# Patient Record
Sex: Female | Born: 1975 | Race: White | Hispanic: No | Marital: Single | Smoking: Current every day smoker
Health system: Southern US, Community
[De-identification: ages and names within clinical notes are randomized; demographics above are authoritative.]

## PROBLEM LIST (undated history)

## (undated) DIAGNOSIS — T8859XA Other complications of anesthesia, initial encounter: Secondary | ICD-10-CM

## (undated) DIAGNOSIS — Z720 Tobacco use: Secondary | ICD-10-CM

## (undated) DIAGNOSIS — F1910 Other psychoactive substance abuse, uncomplicated: Secondary | ICD-10-CM

## (undated) DIAGNOSIS — T4145XA Adverse effect of unspecified anesthetic, initial encounter: Secondary | ICD-10-CM

## (undated) DIAGNOSIS — D649 Anemia, unspecified: Secondary | ICD-10-CM

## (undated) DIAGNOSIS — E611 Iron deficiency: Secondary | ICD-10-CM

## (undated) DIAGNOSIS — E119 Type 2 diabetes mellitus without complications: Secondary | ICD-10-CM

## (undated) DIAGNOSIS — F1020 Alcohol dependence, uncomplicated: Secondary | ICD-10-CM

## (undated) HISTORY — PX: LIVER BIOPSY: SHX301

---

## 1999-03-13 ENCOUNTER — Inpatient Hospital Stay (HOSPITAL_COMMUNITY): Admission: AD | Admit: 1999-03-13 | Discharge: 1999-03-16 | Payer: Self-pay | Admitting: *Deleted

## 1999-04-25 ENCOUNTER — Other Ambulatory Visit: Admission: RE | Admit: 1999-04-25 | Discharge: 1999-04-25 | Payer: Self-pay | Admitting: Obstetrics and Gynecology

## 1999-09-21 ENCOUNTER — Inpatient Hospital Stay (HOSPITAL_COMMUNITY): Admission: AD | Admit: 1999-09-21 | Discharge: 1999-09-22 | Payer: Self-pay | Admitting: *Deleted

## 2003-03-03 ENCOUNTER — Encounter: Payer: Self-pay | Admitting: Obstetrics & Gynecology

## 2003-03-03 ENCOUNTER — Inpatient Hospital Stay (HOSPITAL_COMMUNITY): Admission: AD | Admit: 2003-03-03 | Discharge: 2003-03-03 | Payer: Self-pay | Admitting: Obstetrics & Gynecology

## 2003-03-04 ENCOUNTER — Other Ambulatory Visit: Admission: RE | Admit: 2003-03-04 | Discharge: 2003-03-04 | Payer: Self-pay | Admitting: Obstetrics and Gynecology

## 2003-03-07 ENCOUNTER — Other Ambulatory Visit: Admission: RE | Admit: 2003-03-07 | Discharge: 2003-03-07 | Payer: Self-pay | Admitting: Obstetrics and Gynecology

## 2003-05-03 ENCOUNTER — Ambulatory Visit (HOSPITAL_COMMUNITY): Admission: RE | Admit: 2003-05-03 | Discharge: 2003-05-03 | Payer: Self-pay | Admitting: Obstetrics & Gynecology

## 2003-08-20 ENCOUNTER — Ambulatory Visit (HOSPITAL_COMMUNITY): Admission: AD | Admit: 2003-08-20 | Discharge: 2003-08-20 | Payer: Self-pay | Admitting: Obstetrics & Gynecology

## 2003-08-24 ENCOUNTER — Inpatient Hospital Stay (HOSPITAL_COMMUNITY): Admission: AD | Admit: 2003-08-24 | Discharge: 2003-08-26 | Payer: Self-pay | Admitting: Obstetrics & Gynecology

## 2005-07-15 ENCOUNTER — Encounter: Admission: RE | Admit: 2005-07-15 | Discharge: 2005-07-15 | Payer: Self-pay | Admitting: General Practice

## 2006-05-05 ENCOUNTER — Emergency Department (HOSPITAL_COMMUNITY): Admission: EM | Admit: 2006-05-05 | Discharge: 2006-05-05 | Payer: Self-pay | Admitting: Emergency Medicine

## 2007-02-22 ENCOUNTER — Other Ambulatory Visit: Payer: Self-pay | Admitting: Obstetrics & Gynecology

## 2007-02-22 ENCOUNTER — Emergency Department (HOSPITAL_COMMUNITY): Admission: EM | Admit: 2007-02-22 | Discharge: 2007-02-23 | Payer: Self-pay | Admitting: Emergency Medicine

## 2008-04-16 ENCOUNTER — Inpatient Hospital Stay (HOSPITAL_COMMUNITY): Admission: EM | Admit: 2008-04-16 | Discharge: 2008-04-18 | Payer: Self-pay | Admitting: Emergency Medicine

## 2008-04-18 ENCOUNTER — Inpatient Hospital Stay (HOSPITAL_COMMUNITY): Admission: AD | Admit: 2008-04-18 | Discharge: 2008-04-21 | Payer: Self-pay | Admitting: Psychiatry

## 2008-04-18 ENCOUNTER — Ambulatory Visit: Payer: Self-pay | Admitting: Psychiatry

## 2009-04-22 ENCOUNTER — Emergency Department (HOSPITAL_COMMUNITY): Admission: EM | Admit: 2009-04-22 | Discharge: 2009-04-22 | Payer: Self-pay | Admitting: Emergency Medicine

## 2010-10-16 NOTE — H&P (Signed)
[name], [name]                ACCOUNT NO.:  [id number]   MEDICAL RECORD NO.:  [id number]          PATIENT TYPE:  INP   LOCATION:  1824                         FACILITY:  [facility]   PHYSICIAN:  Lonia Blood, M.D.       DATE OF BIRTH:  27-Apr-1976   DATE OF ADMISSION:  04/16/2008  DATE OF DISCHARGE:                              HISTORY & PHYSICAL   PRIMARY CARE PHYSICIAN:  Lucky Cowboy, MD   CHIEF COMPLAINT:  Overdose.   HISTORY OF PRESENT ILLNESS:  [name] is a 35 year old woman with  history of anxiety who took about 30 tablets of 1 mg of Xanax due to  extreme stress.  The history is provided by the [name] who  says that the patient probably did not try to kill herself, but she is  on immense amount of stress and did an impulsive gesture.  He is not  aware of her having any prior history of suicide attempts.   PAST MEDICAL HISTORY:  Significant for anxiety.   FAMILY HISTORY:  Mother is alive, has suffered severe motor vehicle  accident.  Father's history is unknown.  The patient has a sister  without any psychiatric problems.   SOCIAL HISTORY:  The patient has 2 biological children and 2  stepchildren.  She works in Designer, jewellery.  She smokes a pack and a  half cigarettes a day.  Drinks alcohol occasionally.   ALLERGIES:  No known drug allergies.   MEDICATIONS:  Xanax.   REVIEW OF SYSTEMS:  Unobtainable.   PHYSICAL EXAMINATION:  VITAL SIGNS:  Temperature 98.5, pulse 90,  respirations 16, blood pressure 192/58.  GENERAL APPEARANCE:  This 35 year old woman who is comatose on the  stretcher.  HEENT:  Head appears normocephalic and atraumatic.  Eyes upon forcefully  opening both the Jane eyes, the pupils are mydriatic about 6 mm and  reactive to light.  The patient has an intact gag reflex.  CHEST:  Clear to auscultation without wheezes, rhonchi, or crackles.  HEART:  Regular rate and rhythm without murmurs, rubs, or gallops.  ABDOMEN:  Soft, nontender.   Bowel sounds are present.  LOWER EXTREMITIES:  Without edema.   LABORATORY VALUES:  On admission, sodium is 141, potassium 3.6, chloride  109, bicarbonate 26, BUN 9, creatinine 0.46, glucose 85.  Urinalysis  within normal limits.  Urine drug screen is positive for opiates and  benzodiazepines.  White blood cell count is 11.8, hemoglobin is 14,  platelet count 358.   ASSESSMENT AND PLAN:  This is a 35 year old woman with benzodiazepine  and opiate overdose currently comatose.  Plan is to admit her to an  intensive care unit for airway monitoring and vital signs monitoring.  Placed the patient on intravenous fluids, vitamins, and obtain a  psychiatric consultation once she wakes up.      Lonia Blood, M.D.  Electronically Signed     SL/MEDQ  D:  04/16/2008  T:  04/16/2008  Job:  [id number]

## 2010-10-19 NOTE — H&P (Signed)
[name], [name]                            ACCOUNT NO.:  [id number]   MEDICAL RECORD NO.:  [id number]                  PATIENT TYPE:   LOCATION:                                       FACILITY:   PHYSICIAN:  Lazaro Arms, M.D.                DATE OF BIRTH:  January 25, 1976   DATE OF ADMISSION:  DATE OF DISCHARGE:                                HISTORY & PHYSICAL   HISTORY OF PRESENT ILLNESS:  This patient is a 35 year old white female,  gravida 3, para 1, abortus 1 with estimated date of delivery of August 29, 2003.  Currently at 39-4/[redacted] weeks gestation.  The patient is admitted for  induction of labor.  Cervix, at the time of assessment on March 21 was  unfavorable; so, if it is still that way she will undergo Cervidil, if not  Foley will be placed in the cervix.  Her prenatal course has been  unremarkable except for an elevated Down syndrome risk, but she was seen at  [facility] perinatal and had an amniocentesis. It was 85 XY with normal  chromosomes; otherwise pregnancy has been uncomplicated.  The patient came  in a couple of days ago complaining of numbness and tingling of both arms,  increased swelling.  Her face was swollen, but her blood pressure was  normal.  I think that it was probably from nerve compression.  She is  admitted for induction electively.   PAST MEDICAL HISTORY:  PID at age 68.   PAST SURGERY:  Negative except for an elective termination in 1995.   PAST OBSTETRICAL:  1. Elective termination.  2. Vaginal delivery in 2001.   ALLERGIES:  None.   MEDICATIONS:  Prenatal vitamins.   REVIEW OF SYSTEMS:  Otherwise negative.  Blood type is A positive, antibody  screen is negative.  Rubella is immune.  Hepatitis B is negative.  HIV is  negative.  Serology is nonreactive.  Pap was normal.  GC and Chlamydia are  both negative.  Her Glucola was 101.  Her group B strep culture is negative.   PHYSICAL EXAMINATION:  HEENT:  Unremarkable.  NECK:  Thyroid is normal.  LUNGS:  Clear.  HEART:  Regular rate and rhythm.  No murmurs, regurgitation, or gallops.  BREASTS:  Deferred.  ABDOMEN:  Fundal height of 38 cm.  Cervix is fingertip thick and minus 3  vertex.  EXTREMITIES:  With 1+ edema.  NEUROLOGICAL:  Exam is grossly intact.   IMPRESSION:  1. Intrauterine pregnancy at 39.[redacted] weeks gestation.  2. Elective induction.   The patient understands the risks, benefits, indications and alternatives  and will proceed.     ___________________________________________                                         Amaryllis Dyke  Despina Hidden, M.D.   Loraine Maple  D:  08/24/2003  T:  08/24/2003  Job:  [id number]

## 2010-10-19 NOTE — Op Note (Signed)
NAMESHERHONDA, GASPAR                          ACCOUNT NO.:  [id number]   MEDICAL RECORD NO.:  [id number]                   PATIENT TYPE:  INP   LOCATION:  A403                                 FACILITY:  [facility]   PHYSICIAN:  Lazaro Arms, M.D.                DATE OF BIRTH:  Jun 19, 1975   DATE OF PROCEDURE:  08/25/2003  DATE OF DISCHARGE:  08/26/2003                                 OPERATIVE REPORT   PROCEDURE:  Epidural placement.   INDICATIONS FOR PROCEDURE:  [name] is a 35 year old gravida 2, para 1 being  induced, with a favorable cervix.  She is 4 cm and requesting an epidural.   DESCRIPTION OF PROCEDURE:  The patient was placed in the sitting position.  Betadine prep was used.  The L2-L3 interspace was identified.  Lidocaine 1%  was injected as a local anesthetic and the area was field draped.  A 17-  gauge Tuohy needle was employed and a loss of resistance technique used.  The epidural space was found with one pass without difficulty, and 10 cc of  0.125% bupivacaine plain was given as a test dose without ill effects.  An  additional 10 cc was given to dose up the epidural.  The epidural catheter  was fed and taped down 5 cm into the epidural space.  The continuous pump  was started at 12 cc an hour of 0.125% bupivacaine with 2 mcg per cc of  Fentanyl.   The patient tolerated the procedure well with no ill effects, with a  reassuring fetal heart rate tracing.      ___________________________________________                                            Lazaro Arms, M.D.   LHE/MEDQ  D:  09/13/2003  T:  09/13/2003  Job:  [id number]
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
## 2010-10-19 NOTE — Procedures (Signed)
NAMETRINTY, MARKEN                          ACCOUNT NO.:  [id number]   MEDICAL RECORD NO.:  [id number]                   PATIENT TYPE:  OUT   LOCATION:  RAD                                  FACILITY:  [facility]   PHYSICIAN:  Vida Roller, M.D.                DATE OF BIRTH:  1975-10-03   DATE OF PROCEDURE:  05/03/2003  DATE OF DISCHARGE:                                  ECHOCARDIOGRAM   TAPE NUMBER:  LB - [id number].   TAPE COUNT:  00 - 431.   INDICATIONS FOR PROCEDURE:  This is a 35 year old woman with chest pain.   TECHNICAL QUALITY:  Good.   M-MODE TRACINGS:  The aorta is 30 mm.   The left atrium is 39 mm.   The septum is 9 mm.   The posterior wall is 9 mm.   Left ventricular diastolic dimension is 51 mm.   Left ventricular systolic dimension is 35 mm.   2-D AND DOPPLER IMAGING:  The left ventricle is normal size with normal  systolic function. There are no wall motion abnormalities seen. The  diastolic function is normal. Estimated ejection fraction is 60% to 65%.   The right ventricle is normal size with normal systolic function. There is a  slight increase in the diastolic diameter, consistent with the patient's  pregnant state.   Both atria are normal size. There is no evidence of an atrial septal defect.   The aortic valve is trileaflet, tri-commissural with no evidence of stenosis  or regurgitation.   The mitral valve is morphologically normal with trivial insufficiency. No  stenosis is seen.   The tricuspid valve is morphologically normal with trivial insufficiency. No  stenosis is seen.   The pulmonic valve appears to be morphologically unremarkable with no  stenosis or trivial insufficiency.   Pericardial structures appear normal.   The ascending aorta is not well seen.   The inferior vena cava appears to be normal size.      ___________________________________________                                            Vida Roller, M.D.   JH/MEDQ  D:   05/04/2003  T:  05/04/2003  Job:  [id number]

## 2010-10-19 NOTE — Op Note (Signed)
[name], [name]                          ACCOUNT NO.:  [id number]   MEDICAL RECORD NO.:  [id number]                   PATIENT TYPE:  INP   LOCATION:  A403                                 FACILITY:  [facility]   PHYSICIAN:  Jacklyn Shell, C.N.M.    DATE OF BIRTH:  Sep 06, 1975   DATE OF PROCEDURE:  DATE OF DISCHARGE:                                 OPERATIVE REPORT   DELIVERY NOTE   [name] progressed nicely through labor and developed an urge to push at  approximately 11:45 a.m.  She pushed steadily without difficulty and  delivered a viable female infant at 32.  A nuchal cord was reduced.  The  mouth and nose were suctioned on the perineum; and the body delivered  without difficulty.  Twenty units of Pitocin diluted in 1000 cc of lactated  Ringers was then infused rapidly IV.   The placenta separated spontaneously and delivered by a controlled cord  traction at 1342.  It was inspected and appears to be intact.  She did have  some extra blood loss and some oozing from the uterus.  Some clots were  removed and 125 mcg of Hemabate were given IM and this resolved her  bleeding.  The vagina was then inspected and no lacerations were found.  The  weight of the baby 6 pounds 15 ounces.  Apgars were 9 and 9.  The epidural  catheter was then removed with the blue tip visualized as being intact.  The  family bonded very appropriately with the baby and tolerated the delivery  well.      ___________________________________________                                            Jacklyn Shell, C.N.M.   FC/MEDQ  D:  08/25/2003  T:  08/25/2003  Job:  [id number]   cc:   [facility]
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
## 2011-03-06 LAB — RAPID URINE DRUG SCREEN, HOSP PERFORMED
Amphetamines: NOT DETECTED
Barbiturates: NOT DETECTED
Benzodiazepines: POSITIVE — AB
Cocaine: NOT DETECTED
Opiates: POSITIVE — AB
Tetrahydrocannabinol: NOT DETECTED

## 2011-03-06 LAB — URINALYSIS, ROUTINE W REFLEX MICROSCOPIC
Bilirubin Urine: NEGATIVE
Glucose, UA: NEGATIVE
Hgb urine dipstick: NEGATIVE
Ketones, ur: NEGATIVE
Nitrite: NEGATIVE
Protein, ur: NEGATIVE
Specific Gravity, Urine: 1.007
Urobilinogen, UA: 0.2
pH: 6.5

## 2011-03-06 LAB — CBC
HCT: 40.5
Hemoglobin: 14
MCHC: 34.5
MCV: 95.2
Platelets: 235
Platelets: 358
RBC: 4.25
RDW: 13.6
RDW: 13.7
WBC: 11.8 — ABNORMAL HIGH
WBC: 8.2

## 2011-03-06 LAB — COMPREHENSIVE METABOLIC PANEL WITH GFR
BUN: 9
Calcium: 8.8
Creatinine, Ser: 0.46
Glucose, Bld: 85
Total Protein: 6.1

## 2011-03-06 LAB — COMPREHENSIVE METABOLIC PANEL
ALT: 10
ALT: 11
AST: 12
AST: 15
Albumin: 3.4 — ABNORMAL LOW
Albumin: 3.7
Alkaline Phosphatase: 58
Alkaline Phosphatase: 70
CO2: 26
Chloride: 108
Chloride: 109
GFR calc Af Amer: >60
GFR calc Af Amer: >60
GFR calc non Af Amer: >60
Potassium: 3.4 — ABNORMAL LOW
Potassium: 3.6
Sodium: 141
Sodium: 142
Total Bilirubin: 0.4
Total Bilirubin: 0.5
Total Protein: 5.6 — ABNORMAL LOW

## 2011-03-06 LAB — DIFFERENTIAL
Basophils Absolute: 0.1
Basophils Relative: 1
Eosinophils Absolute: 0.2
Eosinophils Relative: 2
Lymphocytes Relative: 36
Lymphs Abs: 4.3 — ABNORMAL HIGH
Monocytes Absolute: 0.7
Monocytes Relative: 6
Neutro Abs: 6.4
Neutrophils Relative %: 55

## 2011-03-06 LAB — BASIC METABOLIC PANEL
BUN: 10
Calcium: 8.5
GFR calc non Af Amer: >60
Glucose, Bld: 99

## 2011-03-06 LAB — ETHANOL: Alcohol, Ethyl (B): 147 — ABNORMAL HIGH

## 2011-03-06 LAB — PREGNANCY, URINE: Preg Test, Ur: NEGATIVE

## 2011-03-06 LAB — ACETAMINOPHEN LEVEL: Acetaminophen (Tylenol), Serum: <10 — ABNORMAL LOW

## 2011-03-14 LAB — URINALYSIS, ROUTINE W REFLEX MICROSCOPIC
Bilirubin Urine: NEGATIVE
Glucose, UA: NEGATIVE
Hgb urine dipstick: NEGATIVE
Ketones, ur: 15 — AB
Protein, ur: NEGATIVE

## 2011-03-14 LAB — POCT PREGNANCY, URINE: Preg Test, Ur: NEGATIVE

## 2014-11-30 LAB — OB RESULTS CONSOLE RPR: RPR: NONREACTIVE

## 2014-11-30 LAB — OB RESULTS CONSOLE ANTIBODY SCREEN: ANTIBODY SCREEN: NEGATIVE

## 2014-11-30 LAB — OB RESULTS CONSOLE HIV ANTIBODY (ROUTINE TESTING)
HIV: NONREACTIVE
HIV: NONREACTIVE

## 2014-11-30 LAB — OB RESULTS CONSOLE HEPATITIS B SURFACE ANTIGEN: Hepatitis B Surface Ag: NEGATIVE

## 2014-11-30 LAB — OB RESULTS CONSOLE ABO/RH: RH Type: POSITIVE

## 2014-11-30 LAB — OB RESULTS CONSOLE RUBELLA ANTIBODY, IGM: RUBELLA: IMMUNE

## 2014-12-02 ENCOUNTER — Other Ambulatory Visit (HOSPITAL_COMMUNITY): Payer: Self-pay | Admitting: Obstetrics and Gynecology

## 2014-12-02 DIAGNOSIS — IMO0002 Reserved for concepts with insufficient information to code with codable children: Secondary | ICD-10-CM

## 2014-12-02 DIAGNOSIS — O09522 Supervision of elderly multigravida, second trimester: Secondary | ICD-10-CM

## 2014-12-02 DIAGNOSIS — Z0489 Encounter for examination and observation for other specified reasons: Secondary | ICD-10-CM

## 2014-12-02 LAB — US OB FOLLOW UP

## 2014-12-08 ENCOUNTER — Encounter (HOSPITAL_COMMUNITY): Payer: Self-pay | Admitting: Obstetrics and Gynecology

## 2014-12-13 ENCOUNTER — Ambulatory Visit (HOSPITAL_COMMUNITY): Payer: Medicaid Other

## 2014-12-13 ENCOUNTER — Ambulatory Visit (HOSPITAL_COMMUNITY): Admission: RE | Admit: 2014-12-13 | Payer: Medicaid Other | Source: Ambulatory Visit

## 2015-01-06 ENCOUNTER — Other Ambulatory Visit (HOSPITAL_COMMUNITY): Payer: Self-pay | Admitting: Obstetrics and Gynecology

## 2015-01-06 ENCOUNTER — Ambulatory Visit (HOSPITAL_COMMUNITY)
Admission: RE | Admit: 2015-01-06 | Discharge: 2015-01-06 | Disposition: A | Discharge status: Home / Self Care | Payer: Medicaid Other | Source: Ambulatory Visit | Attending: Obstetrics and Gynecology | Admitting: Obstetrics and Gynecology

## 2015-01-06 ENCOUNTER — Encounter (HOSPITAL_COMMUNITY): Payer: Self-pay

## 2015-01-06 DIAGNOSIS — Z0489 Encounter for examination and observation for other specified reasons: Secondary | ICD-10-CM

## 2015-01-06 DIAGNOSIS — Z3A26 26 weeks gestation of pregnancy: Secondary | ICD-10-CM | POA: Insufficient documentation

## 2015-01-06 DIAGNOSIS — IMO0002 Reserved for concepts with insufficient information to code with codable children: Secondary | ICD-10-CM

## 2015-01-06 DIAGNOSIS — Z315 Encounter for genetic counseling: Secondary | ICD-10-CM | POA: Diagnosis present

## 2015-01-06 DIAGNOSIS — Z36 Encounter for antenatal screening of mother: Secondary | ICD-10-CM | POA: Diagnosis not present

## 2015-01-06 DIAGNOSIS — O09529 Supervision of elderly multigravida, unspecified trimester: Secondary | ICD-10-CM | POA: Insufficient documentation

## 2015-01-06 DIAGNOSIS — O09522 Supervision of elderly multigravida, second trimester: Secondary | ICD-10-CM | POA: Diagnosis not present

## 2015-01-06 NOTE — Progress Notes (Addendum)
Genetic Counseling  High-Risk Gestation Note  Appointment Date:  01/06/2015 Referred By: Marylen Ponto, DO Date of Birth:  Feb 25, 1976 Partner:  [name]   Pregnancy History: [id number] Estimated Date of Delivery: 04/12/15 Estimated Gestational Age: [redacted]w[redacted]d Attending: Particia Nearing, MD   [name] and her partner, Mr. [name], were seen for genetic counseling because of a maternal age of 39 y.o..     In Summary:  Advanced maternal age; discussed options of ultrasound, NIPS, and amniocentesis  Patient elected for ultrasound only today; declined NIPS and amniocentesis at this time  They were counseled regarding maternal age and the association with risk for chromosome conditions due to nondisjunction with aging of the ova.   We reviewed chromosomes, nondisjunction, and the associated 1 in 11 risk for fetal aneuploidy related to a maternal age of 39 y.o. at [redacted]w[redacted]d gestation.  They were counseled that the risk for aneuploidy decreases as gestational age increases, accounting for those pregnancies which spontaneously abort.  We specifically discussed Down syndrome (trisomy 47), trisomies 31 and 59, and sex chromosome aneuploidies (47,XXX and 47,XXY) including the common features and prognoses of each.   We reviewed available screening options including noninvasive prenatal screening (NIPS)/cell free DNA (cfDNA) testing and detailed ultrasound.  They were counseled that screening tests are used to modify a patient's a priori risk for aneuploidy, typically based on age. This estimate provides a pregnancy specific risk assessment. We reviewed the benefits and limitations of each option. Specifically, we discussed the conditions for which each test screens, the detection rates, and false positive rates of each. They were also counseled regarding diagnostic testing via amniocentesis. We reviewed the approximate 1 in 300-500 risk for complications for amniocentesis, including spontaneous pregnancy  loss. After consideration of all the options, she elected to proceed with targeted ultrasound only today and declined NIPS and amniocentesis.   A complete ultrasound was performed today. The ultrasound report will be sent under separate cover. They understand that screening tests cannot rule out all birth defects or genetic syndromes. The patient was advised of this limitation and states she still does not want additional testing at this time.   [name] was provided with written information regarding cystic fibrosis (CF) including the carrier frequency and incidence in the Caucasian population, the availability of carrier testing and prenatal diagnosis if indicated.  In addition, we discussed that CF is routinely screened for as part of the [place] newborn screening panel.  She declined CF testing today.   Both family histories were reviewed and found to be noncontributory for birth defects, intellectual disability, and known genetic conditions. Without further information regarding the provided family history, an accurate genetic risk cannot be calculated. Further genetic counseling is warranted if more information is obtained.  [name] denied exposure to environmental toxins or chemical agents. She denied the use of street drugs. She reported drinking alcohol daily during the first month of pregnancy and discontinued alcohol use once becoming aware of the pregnancy at approximately 4-[redacted] weeks gestation. We discussed that the majority of the reported alcohol exposure likely occurred within the "all-or-none period." The all-or-none period was discussed, meaning exposures that occur in the first 4 weeks of gestation are typically thought to either not affect the pregnancy at all or result in a miscarriage. Prenatal alcohol exposure can increase the risk for growth delays, small head size, heart defects, eye and facial differences, as well as behavior problems and learning disabilities. The  risk  of these to occur tends to increase with the amount of alcohol consumed. However, because there is no identified safe amount of alcohol in pregnancy, it is recommended to completely avoid alcohol in pregnancy. Ms. Sleeper understands that there is no definitive test to assess prenatally for fetal alcohol syndrome or fetal alcohol effects. However, we discussed the option of detailed ultrasound to assess fetal growth and development. She reported smoking one pack of cigarettes per day. The associations of smoking in pregnancy were reviewed and cessation encouraged. She denied significant viral illnesses during the course of her pregnancy. Her medical and surgical histories were noncontributory.   I counseled this couple regarding the above risks and available options.  The approximate face-to-face time with the genetic counselor was 40 minutes.  Quinn Plowman, MS,  Certified Genetic Counselor 01/06/2015

## 2015-01-09 ENCOUNTER — Encounter (HOSPITAL_COMMUNITY): Payer: Self-pay | Admitting: Obstetrics and Gynecology

## 2015-01-09 ENCOUNTER — Other Ambulatory Visit (HOSPITAL_COMMUNITY): Payer: Self-pay | Admitting: Obstetrics and Gynecology

## 2015-01-30 ENCOUNTER — Other Ambulatory Visit (HOSPITAL_COMMUNITY): Payer: Self-pay | Admitting: Obstetrics and Gynecology

## 2015-01-30 DIAGNOSIS — Z3A28 28 weeks gestation of pregnancy: Secondary | ICD-10-CM

## 2015-01-30 DIAGNOSIS — O09523 Supervision of elderly multigravida, third trimester: Secondary | ICD-10-CM

## 2015-01-30 DIAGNOSIS — O2693 Pregnancy related conditions, unspecified, third trimester: Secondary | ICD-10-CM

## 2015-02-03 ENCOUNTER — Encounter (HOSPITAL_COMMUNITY): Payer: Self-pay

## 2015-02-03 ENCOUNTER — Ambulatory Visit (HOSPITAL_COMMUNITY)
Admission: RE | Admit: 2015-02-03 | Discharge: 2015-02-03 | Disposition: A | Discharge status: Home / Self Care | Payer: Medicaid Other | Source: Ambulatory Visit | Attending: Obstetrics and Gynecology | Admitting: Obstetrics and Gynecology

## 2015-02-03 DIAGNOSIS — O09523 Supervision of elderly multigravida, third trimester: Secondary | ICD-10-CM | POA: Diagnosis not present

## 2015-02-03 DIAGNOSIS — O2693 Pregnancy related conditions, unspecified, third trimester: Secondary | ICD-10-CM

## 2015-02-03 DIAGNOSIS — Z3A28 28 weeks gestation of pregnancy: Secondary | ICD-10-CM

## 2015-02-17 ENCOUNTER — Encounter (HOSPITAL_COMMUNITY): Payer: Self-pay

## 2015-02-17 ENCOUNTER — Inpatient Hospital Stay (HOSPITAL_COMMUNITY)
Admission: AD | Admit: 2015-02-17 | Discharge: 2015-02-21 | DRG: 774 | Disposition: A | Discharge status: Home / Self Care | Payer: Medicaid Other | Source: Ambulatory Visit | Attending: Family Medicine | Admitting: Family Medicine

## 2015-02-17 ENCOUNTER — Ambulatory Visit (HOSPITAL_COMMUNITY)
Admission: RE | Admit: 2015-02-17 | Discharge: 2015-02-17 | Disposition: A | Discharge status: Home / Self Care | Payer: Medicaid Other | Source: Ambulatory Visit | Attending: Obstetrics and Gynecology | Admitting: Obstetrics and Gynecology

## 2015-02-17 ENCOUNTER — Encounter (HOSPITAL_COMMUNITY): Payer: Self-pay | Admitting: *Deleted

## 2015-02-17 VITALS — BP 116/92 | HR 105 | Wt 136.0 lb

## 2015-02-17 DIAGNOSIS — O26899 Other specified pregnancy related conditions, unspecified trimester: Secondary | ICD-10-CM

## 2015-02-17 DIAGNOSIS — O99333 Smoking (tobacco) complicating pregnancy, third trimester: Secondary | ICD-10-CM | POA: Diagnosis present

## 2015-02-17 DIAGNOSIS — R109 Unspecified abdominal pain: Secondary | ICD-10-CM

## 2015-02-17 DIAGNOSIS — Z3A30 30 weeks gestation of pregnancy: Secondary | ICD-10-CM

## 2015-02-17 DIAGNOSIS — O09523 Supervision of elderly multigravida, third trimester: Secondary | ICD-10-CM

## 2015-02-17 DIAGNOSIS — O43123 Velamentous insertion of umbilical cord, third trimester: Secondary | ICD-10-CM | POA: Diagnosis present

## 2015-02-17 DIAGNOSIS — O4403 Placenta previa specified as without hemorrhage, third trimester: Secondary | ICD-10-CM | POA: Diagnosis present

## 2015-02-17 DIAGNOSIS — O24419 Gestational diabetes mellitus in pregnancy, unspecified control: Secondary | ICD-10-CM | POA: Diagnosis not present

## 2015-02-17 DIAGNOSIS — O24410 Gestational diabetes mellitus in pregnancy, diet controlled: Secondary | ICD-10-CM | POA: Diagnosis present

## 2015-02-17 DIAGNOSIS — O09529 Supervision of elderly multigravida, unspecified trimester: Secondary | ICD-10-CM

## 2015-02-17 HISTORY — DX: Anemia, unspecified: D64.9

## 2015-02-17 HISTORY — DX: Iron deficiency: E61.1

## 2015-02-17 HISTORY — DX: Adverse effect of unspecified anesthetic, initial encounter: T41.45XA

## 2015-02-17 HISTORY — DX: Type 2 diabetes mellitus without complications: E11.9

## 2015-02-17 HISTORY — DX: Other complications of anesthesia, initial encounter: T88.59XA

## 2015-02-17 LAB — CBC
HCT: 31.3 % — ABNORMAL LOW (ref 36.0–46.0)
Hemoglobin: 10.6 g/dL — ABNORMAL LOW (ref 12.0–15.0)
MCH: 31.4 pg (ref 26.0–34.0)
MCHC: 33.9 g/dL (ref 30.0–36.0)
MCV: 92.6 fL (ref 78.0–100.0)
PLATELETS: 317 10*3/uL (ref 150–400)
RBC: 3.38 MIL/uL — ABNORMAL LOW (ref 3.87–5.11)
RDW: 14.3 % (ref 11.5–15.5)
WBC: 17.4 10*3/uL — ABNORMAL HIGH (ref 4.0–10.5)

## 2015-02-17 LAB — URINALYSIS, ROUTINE W REFLEX MICROSCOPIC
BILIRUBIN URINE: NEGATIVE
GLUCOSE, UA: NEGATIVE mg/dL
HGB URINE DIPSTICK: NEGATIVE
Ketones, ur: 15 mg/dL — AB
Leukocytes, UA: NEGATIVE
Nitrite: NEGATIVE
PROTEIN: NEGATIVE mg/dL
Specific Gravity, Urine: 1.015 (ref 1.005–1.030)
UROBILINOGEN UA: 0.2 mg/dL (ref 0.0–1.0)
pH: 8.5 — ABNORMAL HIGH (ref 5.0–8.0)

## 2015-02-17 LAB — GLUCOSE, CAPILLARY
GLUCOSE-CAPILLARY: 103 mg/dL — AB (ref 65–99)
Glucose-Capillary: 95 mg/dL (ref 65–99)

## 2015-02-17 MED ORDER — ZOLPIDEM TARTRATE 5 MG PO TABS
5.0000 mg | ORAL_TABLET | Freq: Every evening | ORAL | Status: DC | PRN
  Administered 2015-02-18 – 2015-02-20 (×3): 5 mg via ORAL
  Filled 2015-02-17 (×3): qty 1

## 2015-02-17 MED ORDER — CALCIUM CARBONATE ANTACID 500 MG PO CHEW: 2.0000 | CHEWABLE_TABLET | ORAL | Status: DC | PRN

## 2015-02-17 MED ORDER — DOCUSATE SODIUM 100 MG PO CAPS
100.0000 mg | ORAL_CAPSULE | Freq: Every day | ORAL | Status: DC
  Filled 2015-02-17: qty 1

## 2015-02-17 MED ORDER — ACETAMINOPHEN 325 MG PO TABS: 650.0000 mg | ORAL_TABLET | ORAL | Status: DC | PRN

## 2015-02-17 MED ORDER — PRENATAL MULTIVITAMIN CH
1.0000 | ORAL_TABLET | Freq: Every day | ORAL | Status: DC
  Administered 2015-02-18 – 2015-02-20 (×3): 1 via ORAL
  Filled 2015-02-17 (×3): qty 1

## 2015-02-17 NOTE — MAU Note (Signed)
Patient presents at [redacted] weeks gestation stating she is here to be monitored. Fetus active. Denies pain, bleeding or discharge.

## 2015-02-17 NOTE — H&P (Signed)
[name] is a 39 y.o. female [phone] @ 30.5wks presenting for admission due to abd cramping approx 3-5x/day. This preg is followed by [facility] in [place] w/ MFM consult, and is significant for 1) low lying placenta w/ velamentous insertion and vasa previa 2) AMA (no testing) 3) A1GDM.  History OB History    Gravida Para Term Preterm AB TAB SAB Ectopic Multiple Living   0 1 1 0 0 0 2     Past Medical History  Diagnosis Date  . Complication of anesthesia   . Medical history non-contributory    Past Surgical History  Procedure Laterality Date  . Liver biopsy     Family History: family history is not on file. Social History:  reports that she has been smoking.  She does not have any smokeless tobacco history on file. She reports that she does not drink alcohol or use illicit drugs.   Prenatal Transfer Tool  Maternal Diabetes: Yes:  Diabetes Type:  Diet controlled Genetic Screening: Declined Maternal Ultrasounds/Referrals: Abnormal:  Findings:   Other:low lying placenta, velamentous ins, vasa previa Fetal Ultrasounds or other Referrals:  None Maternal Substance Abuse:  No Significant Maternal Medications:  None Significant Maternal Lab Results:  None Other Comments:  None  ROS    Blood pressure 101/55, pulse 86, temperature 98.3 F (36.8 C), temperature source Oral, resp. rate 16, height  (1.626 m), weight 63.05 kg (139 lb), last menstrual period 07/06/2014. Exam Physical Exam  Constitutional: She is oriented to person, place, and time. She appears well-developed.  HENT:  Head: Normocephalic.  Neck: Normal range of motion.  Cardiovascular: Normal rate.   Respiratory: Effort normal.  GI:  Baby difficult to trace: EFM 115-120s, +accels, no decels No ctx appreciated on toco  Genitourinary:  Pelvic exam deferred  Musculoskeletal: Normal range of motion.  Neurological: She is alert and oriented to person, place, and time.  Skin: Skin is warm and  dry.  Psychiatric: She has a normal mood and affect. Her behavior is normal. Thought content normal.    U/S today: cephalic, nl fluid, still w/ low lying placenta, velamentous ins, vasa previa  Prenatal labs: from 11/30/14 ABO, Rh:  A+ Antibody:  neg Rubella:  imm RPR:   NR HBsAg:   neg HIV:   NR GBS:     Assessment/Plan: IUP@ 30.5wks Abd cramping Low lying placenta w/ velamentous insertion Vasa Previa A1GDM  Admit to Antenatal per MFM Twice daily NSTs Anticipate delivery for 34-36wks BMZ as delivery approaches  Carb mod diet w/ CBGs  SHAW, KIMBERLY CNM 02/17/2015, 2:34 PM

## 2015-02-18 ENCOUNTER — Encounter (HOSPITAL_COMMUNITY): Payer: Self-pay | Admitting: *Deleted

## 2015-02-18 LAB — GLUCOSE, CAPILLARY
GLUCOSE-CAPILLARY: 94 mg/dL (ref 65–99)
GLUCOSE-CAPILLARY: 149 mg/dL — AB (ref 65–99)
Glucose-Capillary: 83 mg/dL (ref 65–99)
Glucose-Capillary: 109 mg/dL — ABNORMAL HIGH (ref 65–99)

## 2015-02-18 MED ORDER — BETAMETHASONE SOD PHOS & ACET 6 (3-3) MG/ML IJ SUSP
12.0000 mg | Freq: Every day | INTRAMUSCULAR | Status: AC
  Administered 2015-02-18 – 2015-02-19 (×2): 12 mg via INTRAMUSCULAR
  Filled 2015-02-18 (×3): qty 2

## 2015-02-18 NOTE — Progress Notes (Signed)
Patient ID: [name], female   DOB: 18-Nov-1975, 39 y.o.   MRN: [id number] FACULTY PRACTICE ANTEPARTUM(COMPREHENSIVE) NOTE  [name] is a 39 y.o. [id number] at [redacted]w[redacted]d  who is admitted for observation secondary to vasa previa and velamentous cord insertion and abdominal cramping.    Fetal presentation is cephalic. Length of Stay:  1  Days  Date of admission:02/17/2015  Subjective: Patient reports feeling well but not rested. She reports irregular cramping pain- once every couple of hour Patient reports the fetal movement as active. Patient reports uterine contraction  activity as irregular, every 120 minutes. Patient reports  vaginal bleeding as none. Patient describes fluid per vagina as None.  Vitals:  Blood pressure 132/47, pulse 83, temperature 98.2 F (36.8 C), temperature source Oral, resp. rate 18, height 5\' 4"  (1.626 m), weight 139 lb (63.05 kg), last menstrual period 07/06/2014. Filed Vitals:   02/17/15 1541 02/17/15 1758 02/17/15 1944 02/18/15 0556  BP: 119/55 123/54 111/64 132/47  Pulse: 87 89 91 83  Temp: 98.4 F (36.9 C) 97.7 F (36.5 C) 98.5 F (36.9 C) 98.2 F (36.8 C)  TempSrc: Oral Axillary Oral Oral  Resp: 20 18 18 18   Height:      Weight:       Physical Examination:  General appearance - alert, well appearing, and in no distress Fundal Height:  size equals dates Pelvic Exam:  examination not indicated Cervical Exam: Not evaluated.  Extremities: Homans sign is negative, no sign of DVT with DTRs 2+ bilaterally Membranes:intact  Fetal Monitoring:  Baseline: 120 bpm, Variability: Good {> 6 bpm), Accelerations: Reactive, Decelerations: Absent and Toco: no contractions     Labs:  Results for orders placed or performed during the hospital encounter of 02/17/15 (from the past 24 hour(s))  Urinalysis, Routine w reflex microscopic (not at [facility])   Collection Time: 02/17/15 11:58 AM  Result Value Ref Range   Color, Urine YELLOW YELLOW   APPearance CLEAR CLEAR    Specific Gravity, Urine 1.015 1.005 - 1.030   pH 8.5 (H) 5.0 - 8.0   Glucose, UA NEGATIVE NEGATIVE mg/dL   Hgb urine dipstick NEGATIVE NEGATIVE   Bilirubin Urine NEGATIVE NEGATIVE   Ketones, ur 15 (A) NEGATIVE mg/dL   Protein, ur NEGATIVE NEGATIVE mg/dL   Urobilinogen, UA 0.2 0.0 - 1.0 mg/dL   Nitrite NEGATIVE NEGATIVE   Leukocytes, UA NEGATIVE NEGATIVE  CBC   Collection Time: 02/17/15  2:59 PM  Result Value Ref Range   WBC 17.4 (H) 4.0 - 10.5 K/uL   RBC 3.38 (L) 3.87 - 5.11 MIL/uL   Hemoglobin 10.6 (L) 12.0 - 15.0 g/dL   HCT 29.5 (L) 28.4 - 13.2 %   MCV 92.6 78.0 - 100.0 fL   MCH 31.4 26.0 - 34.0 pg   MCHC 33.9 30.0 - 36.0 g/dL   RDW 44.0 10.2 - 72.5 %   Platelets 317 150 - 400 K/uL  Type and screen   Collection Time: 02/17/15  2:59 PM  Result Value Ref Range   ABO/RH(D) A POS    Antibody Screen POS    Sample Expiration 02/20/2015    Antibody Identification NO CLINICALLY SIGNIFICANT ANTIBODY IDENTIFIED    DAT, IgG NEG    Unit Number [id number]    Blood Component Type RED CELLS,LR    Unit division 00    Status of Unit ALLOCATED    Transfusion Status OK TO TRANSFUSE    Crossmatch Result COMPATIBLE    Unit Number [id number]  Blood Component Type RED CELLS,LR    Unit division 00    Status of Unit ALLOCATED    Transfusion Status OK TO TRANSFUSE    Crossmatch Result COMPATIBLE   Glucose, capillary   Collection Time: 02/17/15  6:14 PM  Result Value Ref Range   Glucose-Capillary 103 (H) 65 - 99 mg/dL   Comment 1 Notify RN    Comment 2 Document in Chart   Glucose, capillary   Collection Time: 02/17/15 10:29 PM  Result Value Ref Range   Glucose-Capillary 95 65 - 99 mg/dL  Glucose, capillary   Collection Time: 02/18/15  5:58 AM  Result Value Ref Range   Glucose-Capillary 83 65 - 99 mg/dL    Imaging Studies:      Medications:  Scheduled . docusate sodium  100 mg Oral Daily  . prenatal multivitamin  1 tablet Oral Q1200   I have reviewed the patient's  current medications.  ASSESSMENT: [id number] [redacted]w[redacted]d Estimated Date of Delivery: 04/23/15  Patient Active Problem List   Diagnosis Date Noted  . Vasa previa 02/17/2015  . Advanced maternal age in multigravida 01/06/2015    PLAN: 39 yo with vasa previa and velamentous cord insertion - Continue twice daily fetal testing - Consider betamethasone - Will discuss this case with MFM again  CONSTANT,PEGGY 02/18/2015,7:12 AM

## 2015-02-19 LAB — GLUCOSE, CAPILLARY
GLUCOSE-CAPILLARY: 131 mg/dL — AB (ref 65–99)
GLUCOSE-CAPILLARY: 130 mg/dL — AB (ref 65–99)
GLUCOSE-CAPILLARY: 131 mg/dL — AB (ref 65–99)
Glucose-Capillary: 100 mg/dL — ABNORMAL HIGH (ref 65–99)
Glucose-Capillary: 138 mg/dL — ABNORMAL HIGH (ref 65–99)

## 2015-02-19 MED ORDER — GLYBURIDE 2.5 MG PO TABS
2.5000 mg | ORAL_TABLET | Freq: Two times a day (BID) | ORAL | Status: DC
  Administered 2015-02-19 – 2015-02-20 (×4): 2.5 mg via ORAL
  Filled 2015-02-19 (×5): qty 1

## 2015-02-19 NOTE — Progress Notes (Signed)
Patient ID: [name], female   DOB: 09/13/1975, 39 y.o.   MRN: [id number]  FACULTY PRACTICE ANTEPARTUM(COMPREHENSIVE) NOTE  [name] is a 39 y.o. [id number] at [redacted]w[redacted]d  who is admitted for vasa previa and mild cramping.   Length of Stay:  2  Days  Subjective: Patient reports the fetal movement as active. Patient reports uterine contraction  activity as none. Patient reports  vaginal bleeding as none. Patient describes fluid per vagina as None.  Vitals:  Blood pressure 108/56, pulse 95, temperature 98.1 F (36.7 C), temperature source Oral, resp. rate 18, height  (1.626 m), weight 139 lb (63.05 kg), last menstrual period 07/06/2014. Physical Examination:  General appearance - alert, well appearing, and in no distress Abdomen - soft, nontender,  Extremities - no pedal edema noted, Homan's sign negative bilaterally  Fetal Monitoring:  Baseline: 120 bpm, Variability: Good {> 6 bpm), Accelerations: Reactive and Decelerations: Absent  Labs:  Results for orders placed or performed during the hospital encounter of 02/17/15 (from the past 24 hour(s))  Glucose, capillary   Collection Time: 02/18/15  9:51 AM  Result Value Ref Range   Glucose-Capillary 109 (H) 65 - 99 mg/dL   Comment 1 Notify RN   Glucose, capillary   Collection Time: 02/18/15  2:32 PM  Result Value Ref Range   Glucose-Capillary 94 65 - 99 mg/dL   Comment 1 Notify RN   Glucose, capillary   Collection Time: 02/18/15 10:43 PM  Result Value Ref Range   Glucose-Capillary 149 (H) 65 - 99 mg/dL  Glucose, capillary   Collection Time: 02/19/15  6:05 AM  Result Value Ref Range   Glucose-Capillary 131 (H) 65 - 99 mg/dL    Medications:  Scheduled . betamethasone acetate-betamethasone sodium phosphate  12 mg Intramuscular Daily  . docusate sodium  100 mg Oral Daily  . glyBURIDE  2.5 mg Oral Q12H  . prenatal multivitamin  1 tablet Oral Q1200   I have reviewed the patient's current  medications.  ASSESSMENT: Patient Active Problem List   Diagnosis Date Noted  . Vasa previa 02/17/2015  . Advanced maternal age in multigravida 01/06/2015    PLAN: Continue betamethasone course Glyburide for hyperglycemia post steroids Will discuss with MFM long term plan.  LEGGETT,KELLY H. 02/19/2015,8:23 AM

## 2015-02-20 ENCOUNTER — Encounter (HOSPITAL_COMMUNITY): Payer: Self-pay | Admitting: *Deleted

## 2015-02-20 DIAGNOSIS — O24419 Gestational diabetes mellitus in pregnancy, unspecified control: Secondary | ICD-10-CM

## 2015-02-20 LAB — TYPE AND SCREEN
ABO/RH(D): A POS
ANTIBODY SCREEN: POSITIVE
DAT, IGG: NEGATIVE
UNIT DIVISION: 0
Unit division: 0

## 2015-02-20 LAB — GLUCOSE, CAPILLARY
GLUCOSE-CAPILLARY: 113 mg/dL — AB (ref 65–99)
GLUCOSE-CAPILLARY: 97 mg/dL (ref 65–99)
Glucose-Capillary: 131 mg/dL — ABNORMAL HIGH (ref 65–99)
Glucose-Capillary: 111 mg/dL — ABNORMAL HIGH (ref 65–99)

## 2015-02-20 NOTE — Progress Notes (Signed)
Introduced spiritual care services to patient who states she is hoping to get some more "home time" but anticipates having to be at the hospital until delivery. She has two older children, but is sad that her daughter is pretty much on her own because her step father works nights and sleeps during the day.  She was encouraged to see her son who would be bisiting this afternoon.  She states she feels she is going to go stir crazy and welcomed coloring sheets that I provided.  Pt welcomed spiritual and social support from chaplain. WE had a long conversation about several things.  Will continue to follow.

## 2015-02-20 NOTE — Progress Notes (Signed)
Patient ID: AVICE FUNCHESS, female   DOB: 1975/08/30, 39 y.o.   MRN: [id number] FACULTY PRACTICE ANTEPARTUM(COMPREHENSIVE) NOTE  [name] is a 39 y.o. [id number] at [redacted]w[redacted]d by best clinical estimate who is admitted for cramping with vasa previa.   Fetal presentation is cephalic. Length of Stay:  3  Days  Subjective: Doing well, almost no cramping over last 24 hours. Patient reports the fetal movement as active. Patient reports uterine contraction  activity as none. Patient reports  vaginal bleeding as none. Patient describes fluid per vagina as None.  Vitals:  Blood pressure 122/55, pulse 98, temperature 98.1 F (36.7 C), temperature source Oral, resp. rate 18, height  (1.626 m), weight 139 lb (63.05 kg), last menstrual period 07/06/2014. Physical Examination:  General appearance - alert, well appearing, and in no distress Chest - normal effort Abdomen - gravid, NT Neurological - alert, oriented, normal speech, no focal findings or movement disorder noted Skin - normal coloration and turgor, no rashes, no suspicious skin lesions noted Fundal Height:  size equals dates Extremities: Homans sign is negative, no sign of DVT  Membranes:intact  Fetal Monitoring:  Baseline: 120 bpm, Variability: Good {> 6 bpm), Accelerations: Reactive and Decelerations: Absent  Labs:  Results for orders placed or performed during the hospital encounter of 02/17/15 (from the past 24 hour(s))  Glucose, capillary   Collection Time: 02/19/15  8:55 AM  Result Value Ref Range   Glucose-Capillary 130 (H) 65 - 99 mg/dL   Comment 1 Notify RN   Glucose, capillary   Collection Time: 02/19/15 12:43 PM  Result Value Ref Range   Glucose-Capillary 100 (H) 65 - 99 mg/dL  Glucose, capillary   Collection Time: 02/19/15  5:06 PM  Result Value Ref Range   Glucose-Capillary 131 (H) 65 - 99 mg/dL  Glucose, capillary   Collection Time: 02/19/15 10:14 PM  Result Value Ref Range   Glucose-Capillary 138 (H) 65 - 99  mg/dL  Glucose, capillary   Collection Time: 02/20/15  7:18 AM  Result Value Ref Range   Glucose-Capillary 131 (H) 65 - 99 mg/dL     Medications:  Scheduled . docusate sodium  100 mg Oral Daily  . glyBURIDE  2.5 mg Oral Q12H  . prenatal multivitamin  1 tablet Oral Q1200   I have reviewed the patient's current medications.  ASSESSMENT: Patient Active Problem List   Diagnosis Date Noted  . Gestational diabetes mellitus, antepartum- class A1 02/20/2015  . Vasa previa 02/17/2015  . Advanced maternal age in multigravida 01/06/2015    PLAN: Stable cramping. Will review with MFM long term plan. BS remain elevated but none > 140, should continue to fall since she is now completed her BMZ---will continue her glyburide for now.  Reva Bores, MD 02/20/2015,7:30 AM

## 2015-02-20 NOTE — Progress Notes (Signed)
Initial Nutrition Assessment  DOCUMENTATION CODES:  n/a   INTERVENTION:  Carbohydrate modified gestational diabetic diet   NUTRITION DIAGNOSIS:    Increased nutrient needs  related to pregnancy and fetal growth requirements    as evidenced by  [redacted] weeks pregnant.  GOAL: Pt will meet 90 % of estimated needs      MONITOR:  Weight trend    REASON FOR ASSESSMENT:   Antenatal, Gestational Diabetes    ASSESSMENT:   31 weeks. Pre-pregnancy weight 115 lbs, Pre-pregnancy BMI 19.8. 24 lb weight gain. Weight gain goals 25-35 lbs. Appetite good, diet tolerated withoput nausea   Diet Order:  Diet gestational carb mod Room service appropriate?: Yes; Fluid consistency:: Thin   Height:   Ht Readings from Last 1 Encounters:  02/17/15  (1.626 m)    Weight:   Wt Readings from Last 1 Encounters:  02/17/15 139 lb (63.05 kg)    Ideal Body Weight:     BMI:  Body mass index is 23.85 kg/(m^2).  Estimated Nutritional Needs:   Kcal:  1800-2000  Protein:  76-86 g  Fluid:  2.1L  EDUCATION NEEDS: no education needs identified at this time. Reviewed basics of GDM diet, CHO limits, food groups that are high CHO   [facility] M.Odis Luster LDN Neonatal Nutrition Support Specialist/RD III Pager [phone]      Phone [phone]

## 2015-02-21 LAB — GLUCOSE, CAPILLARY: GLUCOSE-CAPILLARY: 74 mg/dL (ref 65–99)

## 2015-02-21 MED ORDER — GLYBURIDE 2.5 MG PO TABS: 2.5000 mg | ORAL_TABLET | Freq: Two times a day (BID) | ORAL | Status: DC

## 2015-02-21 MED ORDER — BLOOD GLUCOSE MONITOR KIT: PACK | Status: DC

## 2015-02-21 NOTE — Discharge Instructions (Signed)
Placenta Previa   Placenta previa is a condition in pregnant women where the placenta implants in the lower part of the uterus. The placenta either partially or completely covers the opening to the cervix. This is a problem because the baby must pass through the cervix during delivery. There are three types of placenta previa. They include:   1. Marginal placenta previa. The placenta is near the cervix, but does not cover the opening.  2. Partial placenta previa. The placenta covers part of the cervical opening.  3. Complete placenta previa. The placenta covers the entire cervical opening.    Depending on the type of placenta previa, there is a chance the placenta may move into a normal position and no longer cover the cervix as the pregnancy progresses. It is important to keep all prenatal visits with your caregiver.   RISK FACTORS  You may be more likely to develop placenta previa if you:   · Are carrying more than one baby (multiples).    · Have an abnormally shaped uterus.    · Have scars on the lining of the uterus.    · Had previous surgeries involving the uterus, such as a cesarean delivery.    · Have delivered a baby previously.    · Have a history of placenta previa.    · Have smoked or used cocaine during pregnancy.    · Are age 35 or older during pregnancy.    SYMPTOMS  The main symptom of placenta previa is sudden, painless vaginal bleeding during the second half of pregnancy. The amount of bleeding can be light to very heavy. The bleeding may stop on its own, but almost always returns. Cramping, regular contractions, abdominal pain, and lower back pain can also occur with placenta previa.   DIAGNOSIS  Placenta previa can be diagnosed through an ultrasound by finding where the placenta is located. The ultrasound may find placenta previa either during a routine prenatal visit or after vaginal bleeding is noticed. If you are diagnosed with placenta previa, your caregiver may avoid vaginal exams to reduce  the risk of heavy bleeding. There is a chance that placenta previa may not be diagnosed until bleeding occurs during labor.   TREATMENT  Specific treatment depends on:   · How much you are bleeding or if the bleeding has stopped.  · How far along you are in your pregnancy.    · The condition of the baby.    · The location of the baby and placenta.    · The type of placenta previa.    Depending on the factors above, your caregiver may recommend:   · Decreased activity.    · Bed rest at home or in the hospital.  · Pelvic rest. This means no sex, using tampons, douching, pelvic exams, or placing anything into the vagina.  · A blood transfusion to replace maternal blood loss.  · A cesarean delivery if the bleeding is heavy and cannot be controlled or the placenta completely covers the cervix.  · Medication to stop premature labor or mature the fetal lungs if delivery is needed before the pregnancy is full term.    WHEN SHOULD YOU SEEK IMMEDIATE MEDICAL CARE IF YOU ARE SENT HOME WITH PLACENTA PREVIA?  Seek immediate medical care if you show any symptoms of placenta previa. You will need to go to the hospital to get checked immediately. Again, those symptoms are:  · Sudden, painless vaginal bleeding, even a small amount.  · Cramping or regular contractions.  · Lower back or abdominal pain.  Document Released: 05/20/2005   Document Revised: 01/20/2013 Document Reviewed: 08/21/2012  Exit[employer]® Patient Information ©2015 [employer]. This information is not intended to replace advice given to you by your health care provider. Make sure you discuss any questions you have with your health care provider.

## 2015-02-21 NOTE — Discharge Summary (Addendum)
OB Discharge Summary     Patient Name: [name] DOB: Aug 07, 1975 MRN: [id number]  Date of admission: 02/17/2015 Delivering MD: This patient has no babies on file.  Date of discharge: 02/21/2015  Admitting diagnosis: 30WKS,MONITORING, Vasa previa, contractions Intrauterine pregnancy: [redacted]w[redacted]d     Secondary diagnosis: Gestational Diabetes medication controlled (A2)     Discharge diagnosis: same                                                                                                Hospital course: This is a 39 year old G4 P2 012 at 31 weeks and 2 days who presented to the hospital on 9/16 for uterine cramping. Her pregnancy was complicated by vasa previa with a velamentous cord insertion and a placenta with a succenturiate lobe. Patient was having cramping at the time of ultrasound and the patient was directly admitted to the antenatal floor, given betamethasone. Her cervical length was stable at 4 cm. Patient was observed until today. Patient has been stable without contractions or cervical that ambulation or bleeding. I discussed with maternal-fetal medicine, who believe that it is reasonable to discharge the patient and reviewed with the patient at 32 weeks for continued observation until delivery between 34 and 35 weeks. Due to the steroids, the patient was started on glyburide due to elevated blood sugars. Will continue to have the patient monitor her blood sugars with CBGs fasting and postprandial.  NST this morning nonreactive but appropriate for gestational age.  Physical exam  Filed Vitals:   02/19/15 1950 02/19/15 2254 02/20/15 0950 02/20/15 1946  BP: 135/67 122/55 117/48 132/6  Pulse: 101 98 108   Temp: 98.5 F (36.9 C) 98.1 F (36.7 C) 98.5 F (36.9 C) 98.1 F (36.7 C)  TempSrc: Oral Oral  Oral  Resp: Height:      Weight:       General: alert, cooperative and no distress Heart: regular rate, no murmur Lungs: clear to auscultation bilaterally, no  wheezing. Abdomen: Gravid, size equals dates. Nontender, nondistended, appropriate bowel sounds DVT Evaluation: No evidence of DVT seen on physical exam. Negative Homan's sign. No cords or calf tenderness. Labs: Lab Results  Component Value Date   WBC 17.4* 02/17/2015   HGB 10.6* 02/17/2015   HCT 31.3* 02/17/2015   MCV 92.6 02/17/2015   PLT 317 02/17/2015   CMP 04/18/2008  Glucose 94  BUN 7  Creatinine 0.52  Sodium 142  Potassium 3.4(L)  Chloride 108  CO2 28  Calcium 9.0  Total Protein 5.6(L)  Total Bilirubin 0.5  Alkaline Phos 58  AST 12  ALT 10    Discharge instruction: per After Visit Summary and "Baby and Me Booklet".  Medications:  Current facility-administered medications:  .  acetaminophen (TYLENOL) tablet 650 mg, 650 mg, Oral, Q4H PRN, Arabella Merles, CNM .  calcium carbonate (TUMS - dosed in mg elemental calcium) chewable tablet 400 mg of elemental calcium, 2 tablet, Oral, Q4H PRN, Arabella Merles, CNM .  docusate sodium (COLACE) capsule 100 mg, 100 mg, Oral, Daily, Arabella Merles, CNM,  100 mg at 02/17/15 1900 .  glyBURIDE (DIABETA) tablet 2.5 mg, 2.5 mg, Oral, Q12H, Lesly Dukes, MD, 2.5 mg at 02/20/15 2048 .  prenatal multivitamin tablet 1 tablet, 1 tablet, Oral, Q1200, Arabella Merles, CNM, 1 tablet at 02/20/15 0946 .  zolpidem (AMBIEN) tablet 5 mg, 5 mg, Oral, QHS PRN, Arabella Merles, CNM, 5 mg at 02/20/15 2158  Diet: routine diet  Activity: Advance as tolerated. Pelvic rest.   Outpatient follow up: Return to [facility] at 32 weeks  Greater than 30 minutes spent on discharge of this patient   02/21/2015 Candelaria Celeste JEHIEL, DO

## 2015-02-24 LAB — TYPE AND SCREEN
ABO/RH(D): A POS
ANTIBODY SCREEN: POSITIVE
DAT, IgG: NEGATIVE
UNIT DIVISION: 0
Unit division: 0
Unit division: 0
Unit division: 0

## 2015-02-27 ENCOUNTER — Encounter (HOSPITAL_COMMUNITY): Payer: Self-pay | Admitting: *Deleted

## 2015-02-27 ENCOUNTER — Inpatient Hospital Stay (HOSPITAL_COMMUNITY)
Admission: AD | Admit: 2015-02-27 | Discharge: 2015-03-21 | DRG: 766 | Disposition: A | Discharge status: Home / Self Care | Payer: Medicaid Other | Source: Ambulatory Visit | Attending: Obstetrics & Gynecology | Admitting: Obstetrics & Gynecology

## 2015-02-27 DIAGNOSIS — B353 Tinea pedis: Secondary | ICD-10-CM | POA: Diagnosis present

## 2015-02-27 DIAGNOSIS — E538 Deficiency of other specified B group vitamins: Secondary | ICD-10-CM | POA: Diagnosis present

## 2015-02-27 DIAGNOSIS — O09529 Supervision of elderly multigravida, unspecified trimester: Secondary | ICD-10-CM

## 2015-02-27 DIAGNOSIS — O26893 Other specified pregnancy related conditions, third trimester: Secondary | ICD-10-CM | POA: Diagnosis present

## 2015-02-27 DIAGNOSIS — Z98891 History of uterine scar from previous surgery: Secondary | ICD-10-CM

## 2015-02-27 DIAGNOSIS — Z3A32 32 weeks gestation of pregnancy: Secondary | ICD-10-CM

## 2015-02-27 DIAGNOSIS — Z3A34 34 weeks gestation of pregnancy: Secondary | ICD-10-CM

## 2015-02-27 DIAGNOSIS — O09523 Supervision of elderly multigravida, third trimester: Secondary | ICD-10-CM

## 2015-02-27 DIAGNOSIS — O24419 Gestational diabetes mellitus in pregnancy, unspecified control: Secondary | ICD-10-CM | POA: Diagnosis present

## 2015-02-27 DIAGNOSIS — Z3483 Encounter for supervision of other normal pregnancy, third trimester: Secondary | ICD-10-CM | POA: Diagnosis present

## 2015-02-27 LAB — CBC
HEMATOCRIT: 30.8 % — AB (ref 36.0–46.0)
HEMOGLOBIN: 10.3 g/dL — AB (ref 12.0–15.0)
MCH: 30.7 pg (ref 26.0–34.0)
MCHC: 33.4 g/dL (ref 30.0–36.0)
MCV: 91.9 fL (ref 78.0–100.0)
Platelets: 316 10*3/uL (ref 150–400)
RBC: 3.35 MIL/uL — AB (ref 3.87–5.11)
RDW: 14.3 % (ref 11.5–15.5)
WBC: 16.9 10*3/uL — ABNORMAL HIGH (ref 4.0–10.5)

## 2015-02-27 LAB — TYPE AND SCREEN
ABO/RH(D): A POS
Antibody Screen: NEGATIVE

## 2015-02-27 LAB — GLUCOSE, CAPILLARY
GLUCOSE-CAPILLARY: 94 mg/dL (ref 65–99)
GLUCOSE-CAPILLARY: 114 mg/dL — AB (ref 65–99)
GLUCOSE-CAPILLARY: 71 mg/dL (ref 65–99)

## 2015-02-27 MED ORDER — ZOLPIDEM TARTRATE 5 MG PO TABS
5.0000 mg | ORAL_TABLET | Freq: Every evening | ORAL | Status: DC | PRN
  Administered 2015-02-27 – 2015-03-18 (×20): 5 mg via ORAL
  Filled 2015-02-27 (×20): qty 1

## 2015-02-27 MED ORDER — SODIUM CHLORIDE 0.9 % IJ SOLN
3.0000 mL | Freq: Two times a day (BID) | INTRAMUSCULAR | Status: DC
  Administered 2015-02-27 – 2015-03-18 (×40): 3 mL via INTRAVENOUS

## 2015-02-27 MED ORDER — PRENATAL MULTIVITAMIN CH: 1.0000 | ORAL_TABLET | Freq: Every day | ORAL | Status: DC

## 2015-02-27 MED ORDER — ACETAMINOPHEN 325 MG PO TABS: 650.0000 mg | ORAL_TABLET | ORAL | Status: DC | PRN

## 2015-02-27 MED ORDER — GLYBURIDE 2.5 MG PO TABS
2.5000 mg | ORAL_TABLET | Freq: Two times a day (BID) | ORAL | Status: DC
  Administered 2015-02-27 (×2): 2.5 mg via ORAL
  Filled 2015-02-27 (×3): qty 1

## 2015-02-27 MED ORDER — CALCIUM CARBONATE ANTACID 500 MG PO CHEW: 2.0000 | CHEWABLE_TABLET | ORAL | Status: DC | PRN

## 2015-02-27 MED ORDER — PRENATAL MULTIVITAMIN CH
1.0000 | ORAL_TABLET | Freq: Every day | ORAL | Status: DC
  Administered 2015-02-28 – 2015-03-18 (×19): 1 via ORAL
  Filled 2015-02-27 (×19): qty 1

## 2015-02-27 MED ORDER — DOCUSATE SODIUM 100 MG PO CAPS
100.0000 mg | ORAL_CAPSULE | Freq: Every day | ORAL | Status: DC
  Administered 2015-03-04 – 2015-03-07 (×2): 100 mg via ORAL
  Filled 2015-02-27 (×7): qty 1

## 2015-02-27 NOTE — Progress Notes (Signed)
Pt just returned from ambulating.

## 2015-02-27 NOTE — Progress Notes (Signed)
Patient removed the fetal monitoring device. Patient was told there was not enough tracing at the time.

## 2015-02-27 NOTE — H&P (Signed)
Faculty Practice Antenatal History and Physical  ALVIN RUBANO JSE:[id number] DOB: [birth year] DOA: 02/27/2015  Chief Complaint: Vasa previa  HPI: [name] is a 39 y.o. female [phone] with IUP at 57w1dpresenting for admission secondary to vasa previa. Patient is currently 32 weeks and 1 day. She denies episodes of bleeding, cramping, contractions. She is being directly admitted for observation secondary to vasa previa. Additionally, she had an abnormal 1 hour and 3 hour GTT and has been started on glyburide 2.5 mg twice a day. Her blood sugars are controlled with this regimen.  [facility]. Due to [place] not having access to the NICU, the patient being hospitalized here for the duration of her pregnancy.    Review of Systems:   She complains of intermittent headaches.    the remainder of a 10 system review of systems was otherwise negative  Prenatal History/Complications: Vasa previa, A2 gestational diabetes  Past Medical History: Past Medical History  Diagnosis Date  . Complication of anesthesia   . Iron deficiency   . Diabetes mellitus without complication   . Anemia     Prenicious Anemia    Past Surgical History: Past Surgical History  Procedure Laterality Date  . Liver biopsy      Obstetrical History: OB History    Gravida Para Term Preterm AB TAB SAB Ectopic Multiple Living   _0 0 1 1 0 0 0 2      Gynecological History:   Social History: Social History   Social History  . Marital Status: Single    Spouse Name: N/A  . Number of Children: N/A  . Years of Education: N/A   Social History Main Topics  . Smoking status: Current Every Day Smoker -- 0.25 packs/day  . Smokeless tobacco: None  . Alcohol Use: No  . Drug Use: No  . Sexual Activity: No   Other Topics Concern  . None   Social History Narrative    Family History: Family History  Problem Relation  Age of Onset  . Hypertension Mother   . Mental illness Mother   . Hyperthyroidism Daughter   . Diabetes Maternal Grandmother     Allergies: Allergies  Allergen Reactions  . Eggs Or Egg-Derived Products Nausea Only    Prescriptions prior to admission  Medication Sig Dispense Refill Last Dose  . blood glucose meter kit and supplies KIT Dispense based on patient and insurance preference. Use four times daily. (fasting and 2hr postprandial) (FOR ICD-10 O24.419). 1 each 0 02/26/2015 at Unknown time  . Dermatological Products, Misc. (J & J FIRST AID CREAM EX) Apply 1 application topically as needed.   Past Week at Unknown time  . glyBURIDE (DIABETA) 2.5 MG tablet Take 1 tablet (2.5 mg total) by mouth every 12 (twelve) hours. 14 tablet 0 02/26/2015 at 2000  . Prenatal Vit-Fe Fumarate-FA (PRENATAL MULTIVITAMIN) TABS tablet Take 1 tablet by mouth daily at 12 noon.   02/26/2015 at Unknown time  . pseudoephedrine (SUDAFED) 30 MG tablet Take 30 mg by mouth every 4 (four) hours as needed for congestion.   02/26/2015 at Unknown time  . Cyanocobalamin (B-12 COMPLIANCE INJECTION IJ) Inject as directed once a week. 1 inj shot weekly   02/05/2015    Physical Exam: BP 97/68 mmHg  Pulse 92  Temp(Src) 98.3 F (36.8 C) (Oral)  Resp 18  Ht _1  (1.626 m)  Wt 139 lb (  63.05 kg)  BMI 23.85 kg/m2  LMP 07/06/2014  BP 97/68 mmHg  Pulse 92  Temp(Src) 98.3 F (36.8 C) (Oral)  Resp 18  Ht _0  (1.626 m)  Wt 139 lb (63.05 kg)  BMI 23.85 kg/m2  LMP 07/06/2014 General appearance: alert, cooperative, appears stated age and no distress Head: Normocephalic, without obvious abnormality, atraumatic Eyes: conjunctivae/corneas clear. PERRL, EOM's intact. Fundi benign. Throat: lips, mucosa, and tongue normal; teeth and gums normal Lungs: clear to auscultation bilaterally Heart: regular rate and rhythm, S1, S2 normal, no murmur, click, rub or gallop Abdomen: soft, non-tender; bowel sounds normal; no masses,  no  organomegaly  Fundus: Size equals dates  Extremities: extremities normal, atraumatic, no cyanosis or edema Pulses: 2+ and symmetric Skin: Skin color, texture, turgor normal. No rashes or lesions Lymph nodes: Cervical, supraclavicular, and axillary nodes normal. Neurologic: Alert and oriented X 3, normal strength and tone. Normal symmetric reflexes. Normal coordination and gait cephalic Baseline: 244 bpm, Variability: Good {> 6 bpm), Accelerations: Reactive and Decelerations: Absent None             Labs on Admission:  Basic Metabolic Panel: No results for input(s): NA, K, CL, CO2, GLUCOSE, BUN, CREATININE, CALCIUM, MG, PHOS in the last 168 hours. Liver Function Tests: No results for input(s): AST, ALT, ALKPHOS, BILITOT, PROT, ALBUMIN in the last 168 hours. No results for input(s): LIPASE, AMYLASE in the last 168 hours. No results for input(s): AMMONIA in the last 168 hours. CBC:  Recent Labs Lab 02/27/15 1220  WBC 16.9*  HGB 10.3*  HCT 30.8*  MCV 91.9  PLT 316    CBG:  Recent Labs Lab 02/20/15 1406 02/20/15 2045 02/21/15 0755 02/27/15 1035 02/27/15 1343  GLUCAP 97 111* 74 94 114*    Radiological Exams on Admission: No results found.   Assessment/Plan Present on Admission:  . Vasa previa . Gestational diabetes mellitus, antepartum    admit  NST twice a day  We'll obtain records  Patient status post betamethasone on 9/17 and 9/18.   type and screen every 72 hours  CBC ordered and reviewed.  IV access    As the patient is currently asymptomatic and without bleeding, contractions will allow patient to ambulate briefly (15 minutes twice a day) and allow patient bathroom privileges   CBGs fasting and 2 hours postprandial   continue glyburide 2.5 mg every 12 hours    delivery for maternal or fetal concerns, particularly heavy vaginal bleeding.     Truett Mainland, DO 02/27/2015 1:53 PM Faculty Practice Attending Physician [facility]  [facility] Attending Phone #: [phone]

## 2015-02-28 LAB — GLUCOSE, CAPILLARY
GLUCOSE-CAPILLARY: 66 mg/dL (ref 65–99)
GLUCOSE-CAPILLARY: 105 mg/dL — AB (ref 65–99)
Glucose-Capillary: 112 mg/dL — ABNORMAL HIGH (ref 65–99)
Glucose-Capillary: 102 mg/dL — ABNORMAL HIGH (ref 65–99)

## 2015-02-28 MED ORDER — GLYBURIDE 2.5 MG PO TABS
2.5000 mg | ORAL_TABLET | Freq: Every day | ORAL | Status: DC
  Administered 2015-02-28 – 2015-03-18 (×19): 2.5 mg via ORAL
  Filled 2015-02-28 (×19): qty 1

## 2015-02-28 MED ORDER — NICOTINE 14 MG/24HR TD PT24
14.0000 mg | MEDICATED_PATCH | Freq: Every day | TRANSDERMAL | Status: DC
  Administered 2015-02-28 – 2015-03-18 (×19): 14 mg via TRANSDERMAL
  Filled 2015-02-28 (×21): qty 1

## 2015-02-28 NOTE — Plan of Care (Signed)
Problem: Consults Goal: Birthing Suites Patient Information Press F2 to bring up selections list  Outcome: Not Applicable Date Met:  75/83/07 Patient admitted to antenatal for prolonged hospitalization

## 2015-02-28 NOTE — Progress Notes (Addendum)
Patient ID: [name], female   DOB: 25-Jun-1975, 39 y.o.   MRN: [id number]  FACULTY PRACTICE ANTEPARTUM NOTE  [name] is a 39 y.o. [id number] at [redacted]w[redacted]d  who is admitted for vasa previa.   Fetal presentation is cephalic. Length of Stay:  1  Days  Subjective: Patient without complaints - does feel a little shaky this AM.  CBG this AM was 66.  Took glyburide last night.  Patient reports good fetal movement.   She reports no uterine contractions She reports no bleeding  She reports no loss of fluid per vagina.  Vitals:  Blood pressure 107/55, pulse 86, temperature 98.1 F (36.7 C), temperature source Oral, resp. rate 18, height  (1.626 m), weight 139 lb (63.05 kg), last menstrual period 07/06/2014. Physical Examination:  General appearance - alert, well appearing, and in no distress Chest - clear to auscultation, no wheezes, rales or rhonchi, symmetric air entry Heart - normal rate, regular rhythm, normal S1, S2, no murmurs, rubs, clicks or gallops Abdomen - soft, nontender, nondistended, no masses or organomegaly Fundal Height:  size equals dates Extremities: extremities normal, atraumatic, no cyanosis or edema  Membranes:intact  Fetal Monitoring:  Baseline: 125 bpm, Variability: Good {> 6 bpm), Accelerations: Reactive and Decelerations: Absent  Labs:  Results for orders placed or performed during the hospital encounter of 02/27/15 (from the past 24 hour(s))  Glucose, capillary   Collection Time: 02/27/15 10:35 AM  Result Value Ref Range   Glucose-Capillary 94 65 - 99 mg/dL   Comment 1 Document in Chart    Comment 2 Repeat Test   CBC   Collection Time: 02/27/15 12:20 PM  Result Value Ref Range   WBC 16.9 (H) 4.0 - 10.5 K/uL   RBC 3.35 (L) 3.87 - 5.11 MIL/uL   Hemoglobin 10.3 (L) 12.0 - 15.0 g/dL   HCT 14.7 (L) 82.9 - 56.2 %   MCV 91.9 78.0 - 100.0 fL   MCH 30.7 26.0 - 34.0 pg   MCHC 33.4 30.0 - 36.0 g/dL   RDW 13.0 86.5 - 78.4 %   Platelets 316 150 - 400 K/uL   Type and screen   Collection Time: 02/27/15 12:20 PM  Result Value Ref Range   ABO/RH(D) A POS    Antibody Screen NEG    Sample Expiration 03/02/2015   Glucose, capillary   Collection Time: 02/27/15  1:43 PM  Result Value Ref Range   Glucose-Capillary 114 (H) 65 - 99 mg/dL   Comment 1 Document in Chart    Comment 2 Repeat Test   Glucose, capillary   Collection Time: 02/27/15  7:52 PM  Result Value Ref Range   Glucose-Capillary 71 65 - 99 mg/dL  Glucose, capillary   Collection Time: 02/28/15  7:15 AM  Result Value Ref Range   Glucose-Capillary 66 65 - 99 mg/dL   Comment 1 Notify RN    Comment 2 Document in Chart     Imaging Studies:    none   Medications:  Scheduled . docusate sodium  100 mg Oral Daily  . glyBURIDE  2.5 mg Oral QAC supper  . prenatal multivitamin  1 tablet Oral Q1200  . sodium chloride  3 mL Intravenous Q12H   I have reviewed the patient's current medications.  ASSESSMENT: Patient Active Problem List   Diagnosis Date Noted  . Gestational diabetes mellitus, antepartum 02/20/2015  . Vasa previa 02/17/2015  . Advanced maternal age in multigravida 01/06/2015    PLAN: 1.  Vasa  Previa  Currently without bleeding  Continue modified bedrest  Continue NST BID  Delivery for maternal / fetal concerns 2.  A2 GDM   CBGs fasting and 2hr PP  Will decrease glyburide to daily with supper due to low blood sugar this AM and blood sugar last night was 71.  Continue routine antenatal care.   Rhona Raider Stinson, DO 02/28/2015,7:31 AM

## 2015-03-01 LAB — GLUCOSE, CAPILLARY
GLUCOSE-CAPILLARY: 112 mg/dL — AB (ref 65–99)
Glucose-Capillary: 65 mg/dL (ref 65–99)
Glucose-Capillary: 94 mg/dL (ref 65–99)
Glucose-Capillary: 89 mg/dL (ref 65–99)

## 2015-03-01 NOTE — Progress Notes (Addendum)
Initial Nutrition Assessment  DOCUMENTATION CODES:   Not applicable  INTERVENTION:  CHO modified gestational diet   NUTRITION DIAGNOSIS:   Increased nutrient needs related to  (pregnancy and fetal growth requirements) as evidenced by  ([redacted] weeks pregnant).    GOAL:   Patient will meet greater than or equal to 90% of their needs   MONITOR:   Weight trends  REASON FOR ASSESSMENT:   Gestational Diabetes, Antenatal    ASSESSMENT:   [redacted] weeks pregnant. Pre-pregnancy weight 115 lbs, weight up 24 lbs. Pre-pregnancy BMI 19.8. Weight gain goal 25-35 lbs. Appetite good  Diet Order:  Diet gestational carb mod Room service appropriate?: Yes; Fluid consistency:: Thin  Skin:  Reviewed, no issues  Height:   Ht Readings from Last 1 Encounters:  02/27/15  (1.626 m)    Weight:   Wt Readings from Last 1 Encounters:  03/01/15 134 lb 3.2 oz (60.873 kg)    Ideal Body Weight:  54.5 kg  BMI:  Body mass index is 23.02 kg/(m^2).  Estimated Nutritional Needs:   Kcal:  1800-2000  Protein:  76-86 g  Fluid:  2.1L  EDUCATION NEEDS:   No education needs identified at this time  Inez Pilgrim.Odis Luster LDN Neonatal Nutrition Support Specialist/RD III Pager [phone]      Phone [phone]

## 2015-03-01 NOTE — Progress Notes (Signed)
Patient ID: [name], female   DOB: 05-04-1976, 39 y.o.   MRN: [id number]  FACULTY PRACTICE ANTEPARTUM NOTE  [name] is a 39 y.o. [id number] at [redacted]w[redacted]d  who is admitted for vasa previa.   Fetal presentation is cephalic. Length of Stay:  2  Days  Subjective: Patient without complaints  Patient reports good fetal movement.   She reports no uterine contractions She reports no bleeding  She reports no loss of fluid per vagina.  Vitals:  Blood pressure 111/62, pulse 89, temperature 98.2 F (36.8 C), temperature source Oral, resp. rate 16, height  (1.626 m), weight 139 lb (63.05 kg), last menstrual period 07/06/2014. Physical Examination:  General appearance - alert, well appearing, and in no distress Chest - clear to auscultation, no wheezes, rales or rhonchi, symmetric air entry Heart - normal rate, regular rhythm, normal S1, S2, no murmurs, rubs, clicks or gallops Abdomen - soft, nontender, nondistended, no masses or organomegaly Fundal Height:  size equals dates Extremities: extremities normal, atraumatic, no cyanosis or edema  Membranes:intact  Fetal Monitoring:  Baseline: 135 bpm, Variability: Good {> 6 bpm), Accelerations: Reactive, Decelerations: Absent and [place]: no contractions  Labs:  Results for orders placed or performed during the hospital encounter of 02/27/15 (from the past 24 hour(s))  Glucose, capillary   Collection Time: 02/28/15  9:09 AM  Result Value Ref Range   Glucose-Capillary 112 (H) 65 - 99 mg/dL   Comment 1 Document in Chart    Comment 2 Repeat Test   Glucose, capillary   Collection Time: 02/28/15  2:47 PM  Result Value Ref Range   Glucose-Capillary 102 (H) 65 - 99 mg/dL   Comment 1 Document in Chart    Comment 2 Repeat Test   Glucose, capillary   Collection Time: 02/28/15  7:12 PM  Result Value Ref Range   Glucose-Capillary 105 (H) 65 - 99 mg/dL   Comment 1 Notify RN    Comment 2 Document in Chart   Glucose, capillary   Collection  Time: 03/01/15  6:03 AM  Result Value Ref Range   Glucose-Capillary 65 65 - 99 mg/dL    Imaging Studies:    none   Medications:  Scheduled . docusate sodium  100 mg Oral Daily  . glyBURIDE  2.5 mg Oral QAC supper  . nicotine  14 mg Transdermal Daily  . prenatal multivitamin  1 tablet Oral Q1200  . sodium chloride  3 mL Intravenous Q12H   I have reviewed the patient's current medications.  ASSESSMENT: Patient Active Problem List   Diagnosis Date Noted  . Gestational diabetes mellitus, antepartum 02/20/2015  . Vasa previa 02/17/2015  . Advanced maternal age in multigravida 01/06/2015    PLAN: 1.  Vasa Previa  Currently without bleeding  Continue modified bedrest  Continue NST BID  Delivery for maternal / fetal concerns   2.  A2 GDM   CBGs fasting and 2hr PP within range  Continue current glyburide regimen.  Continue routine antenatal care.   Catalina Antigua, MD 03/01/2015,7:26 AM

## 2015-03-02 DIAGNOSIS — O24419 Gestational diabetes mellitus in pregnancy, unspecified control: Secondary | ICD-10-CM

## 2015-03-02 DIAGNOSIS — E538 Deficiency of other specified B group vitamins: Secondary | ICD-10-CM

## 2015-03-02 DIAGNOSIS — B353 Tinea pedis: Secondary | ICD-10-CM

## 2015-03-02 DIAGNOSIS — Z3A32 32 weeks gestation of pregnancy: Secondary | ICD-10-CM

## 2015-03-02 DIAGNOSIS — O26893 Other specified pregnancy related conditions, third trimester: Secondary | ICD-10-CM

## 2015-03-02 LAB — GLUCOSE, CAPILLARY
GLUCOSE-CAPILLARY: 73 mg/dL (ref 65–99)
Glucose-Capillary: 123 mg/dL — ABNORMAL HIGH (ref 65–99)
Glucose-Capillary: 90 mg/dL (ref 65–99)
Glucose-Capillary: 71 mg/dL (ref 65–99)

## 2015-03-02 LAB — URINALYSIS, ROUTINE W REFLEX MICROSCOPIC
BILIRUBIN URINE: NEGATIVE
Glucose, UA: NEGATIVE mg/dL
HGB URINE DIPSTICK: NEGATIVE
KETONES UR: NEGATIVE mg/dL
Leukocytes, UA: NEGATIVE
NITRITE: NEGATIVE
PH: 8 (ref 5.0–8.0)
Protein, ur: NEGATIVE mg/dL
Specific Gravity, Urine: 1.02 (ref 1.005–1.030)
UROBILINOGEN UA: 0.2 mg/dL (ref 0.0–1.0)

## 2015-03-02 NOTE — Progress Notes (Signed)
Patient ID: [name], female   DOB: [birth year], 39 y.o.   MRN: [id number] FACULTY PRACTICE ANTEPARTUM(COMPREHENSIVE) NOTE  [name] is a 39 y.o. [id number] at [redacted]w[redacted]d  who is admitted for vasa previa.   Fetal presentation is cephalic. Length of Stay:  3  Days  Subjective: Pt remains stable, no complaints. Patient reports the fetal movement as active. Patient reports uterine contraction  activity as none. Patient reports  vaginal bleeding as none. Patient describes fluid per vagina as None.  Vitals:  Blood pressure 124/65, pulse 91, temperature 98.3 F (36.8 C), temperature source Oral, resp. rate 16, height  (1.626 m), weight 60.873 kg (134 lb 3.2 oz), last menstrual period 07/06/2014. Physical Examination:  General appearance - alert, well appearing, and in no distress Heart - normal rate and regular rhythm Abdomen - soft, nontender, nondistended Fundal Height:  size equals dates Cervical Exam: Not evaluated.  Extremities: extremities normal, atraumatic, no cyanosis or edema and Homans sign is negative, no sign of DVT with DTRs 2+ bilaterally Membranes:intact  Fetal Monitoring:  Twice daily  Labs:  Results for orders placed or performed during the hospital encounter of 02/27/15 (from the past 24 hour(s))  Glucose, capillary   Collection Time: 03/01/15 11:02 AM  Result Value Ref Range   Glucose-Capillary 112 (H) 65 - 99 mg/dL   Comment 1 Notify RN    Comment 2 Document in Chart   Glucose, capillary   Collection Time: 03/01/15  2:07 PM  Result Value Ref Range   Glucose-Capillary 94 65 - 99 mg/dL   Comment 1 Notify RN    Comment 2 Document in Chart   Glucose, capillary   Collection Time: 03/01/15  9:45 PM  Result Value Ref Range   Glucose-Capillary 89 65 - 99 mg/dL  Glucose, capillary   Collection Time: 03/02/15  6:24 AM  Result Value Ref Range   Glucose-Capillary 73 65 - 99 mg/dL    Imaging Studies:     Currently EPIC will not allow sonographic studies to  automatically populate into notes.  In the meantime, copy and paste results into note or free text.  Medications:  Scheduled . docusate sodium  100 mg Oral Daily  . glyBURIDE  2.5 mg Oral QAC supper  . nicotine  14 mg Transdermal Daily  . prenatal multivitamin  1 tablet Oral Q1200  . sodium chloride  3 mL Intravenous Q12H   I have reviewed the patient's current medications.  ASSESSMENT: Patient Active Problem List   Diagnosis Date Noted  . Gestational diabetes mellitus, antepartum 02/20/2015  . Vasa previa 02/17/2015  . Advanced maternal age in multigravida 01/06/2015     Inpatient care til delivery.  PLAN: 1. Vasa Previa  Currently without bleeding  Continue modified bedrest  Continue NST BID  Delivery for maternal / fetal concerns   2. A2 GDM   CBGs fasting and 2hr PP within range  Continue current glyburide regimen. FERGUSON,JOHN V 03/02/2015,7:05 AM

## 2015-03-03 LAB — GLUCOSE, CAPILLARY
Glucose-Capillary: 78 mg/dL (ref 65–99)
Glucose-Capillary: 88 mg/dL (ref 65–99)
Glucose-Capillary: 97 mg/dL (ref 65–99)
Glucose-Capillary: 102 mg/dL — ABNORMAL HIGH (ref 65–99)

## 2015-03-03 NOTE — Progress Notes (Signed)
Patient ID: [name], female   DOB: 1975/09/06, 39 y.o.   MRN: [id number] FACULTY PRACTICE ANTEPARTUM(COMPREHENSIVE) NOTE  [name] is a 39 y.o. [id number] at [redacted]w[redacted]d who is admitted for vasa previa.   Fetal presentation is cephalic. Length of Stay:  4  Days  Subjective: Denies any bleeding.  Patient remains stable, no complaints. Patient reports the fetal movement as active. Patient reports uterine contraction  activity as none. Patient reports  vaginal bleeding as none. Patient describes fluid per vagina as None.  Vitals:  Blood pressure 116/60, pulse 94, temperature 98.5 F (36.9 C), temperature source Oral, resp. rate 16, height  (1.626 m), weight 134 lb 3.2 oz (60.873 kg), last menstrual period 07/06/2014. Physical Examination: General appearance - alert, well appearing, and in no distress Heart - normal rate and regular rhythm Abdomen - soft, nontender, nondistended Fundal Height:  size equals dates Cervical Exam: Not evaluated.  Extremities: extremities normal, atraumatic, no cyanosis or edema and Homans sign is negative, no sign of DVT with DTRs 2+ bilaterally Membranes:intact  Fetal Monitoring:  Baseline: 135 bpm, Variability: Moderate {> 6 bpm), Accelerations: Reactive and Decelerations: Absent    Labs:  Results for orders placed or performed during the hospital encounter of 02/27/15 (from the past 24 hour(s))  Type and screen   Collection Time: 03/02/15 11:25 AM  Result Value Ref Range   ABO/RH(D) A POS    Antibody Screen POS    Sample Expiration 03/05/2015    Antibody Identification NO CLINICALLY SIGNIFICANT ANTIBODY IDENTIFIED    DAT, IgG NEG    Unit Number [id number]    Blood Component Type RED CELLS,LR    Unit division 00    Status of Unit ALLOCATED    Transfusion Status OK TO TRANSFUSE    Crossmatch Result COMPATIBLE    Unit Number [id number]    Blood Component Type RED CELLS,LR    Unit division 00    Status of Unit ALLOCATED    Transfusion Status OK TO TRANSFUSE    Crossmatch Result COMPATIBLE   Urinalysis, Routine w reflex microscopic (not at [facility])   Collection Time: 03/02/15 12:45 PM  Result Value Ref Range   Color, Urine YELLOW YELLOW   APPearance CLEAR CLEAR   Specific Gravity, Urine 1.020 1.005 - 1.030   pH 8.0 5.0 - 8.0   Glucose, UA NEGATIVE NEGATIVE mg/dL   Hgb urine dipstick NEGATIVE NEGATIVE   Bilirubin Urine NEGATIVE NEGATIVE   Ketones, ur NEGATIVE NEGATIVE mg/dL   Protein, ur NEGATIVE NEGATIVE mg/dL   Urobilinogen, UA 0.2 0.0 - 1.0 mg/dL   Nitrite NEGATIVE NEGATIVE   Leukocytes, UA NEGATIVE NEGATIVE  Glucose, capillary   Collection Time: 03/02/15  1:29 PM  Result Value Ref Range   Glucose-Capillary 123 (H) 65 - 99 mg/dL   Comment 1 Notify RN    Comment 2 Document in Chart   Glucose, capillary   Collection Time: 03/02/15  6:35 PM  Result Value Ref Range   Glucose-Capillary 90 65 - 99 mg/dL  Glucose, capillary   Collection Time: 03/02/15 10:20 PM  Result Value Ref Range   Glucose-Capillary 71 65 - 99 mg/dL  Glucose, capillary   Collection Time: 03/03/15  6:10 AM  Result Value Ref Range   Glucose-Capillary 78 65 - 99 mg/dL    Imaging Studies:     Currently EPIC will not allow sonographic studies to automatically populate into notes.  In the meantime, copy and paste results into note or free text.  Medications:  Scheduled . docusate sodium  100 mg Oral Daily  . glyBURIDE  2.5 mg Oral QAC supper  . nicotine  14 mg Transdermal Daily  . prenatal multivitamin  1 tablet Oral Q1200  . sodium chloride  3 mL Intravenous Q12H   I have reviewed the patient's current medications.  ASSESSMENT: Patient Active Problem List   Diagnosis Date Noted  . Gestational diabetes mellitus, antepartum 02/20/2015  . Vasa previa 02/17/2015  . Advanced maternal age in multigravida 01/06/2015      PLAN: 1. Vasa Previa  Currently without bleeding  Continue modified bedrest  Continue NST BID;   Category 1 FHR tracing noted twice daily  Type and Screen renewed every 72 hours  Delivery for maternal / fetal concerns   2. A2 GDM   CBGs fasting and 2hr PP within range  Continue current glyburide regimen.  Continue routine antenatal care, will remain inpatient until delivery.  ANYANWU,[name] A, MD 03/03/2015,6:34 AM

## 2015-03-04 LAB — GLUCOSE, CAPILLARY
GLUCOSE-CAPILLARY: 103 mg/dL — AB (ref 65–99)
GLUCOSE-CAPILLARY: 132 mg/dL — AB (ref 65–99)
Glucose-Capillary: 80 mg/dL (ref 65–99)
Glucose-Capillary: 91 mg/dL (ref 65–99)

## 2015-03-04 NOTE — Progress Notes (Signed)
Patient ID: [name], female   DOB: [birth year], 39 y.o.   MRN: [id number] FACULTY PRACTICE ANTEPARTUM(COMPREHENSIVE) NOTE  [name] is a 39 y.o. [id number] at [redacted]w[redacted]d by best clinical estimate who is admitted for vasa previa.   Fetal presentation is cephalic. Length of Stay:  5  Days  Subjective: Denies any bleeding and without complaints. Patient reports the fetal movement as active. Patient reports uterine contraction  activity as none. Patient reports  vaginal bleeding as none. Patient describes fluid per vagina as None.  Vitals:  Blood pressure 139/83, pulse 98, temperature 98.3 F (36.8 C), temperature source Oral, resp. rate 20, height  (1.626 m), weight 134 lb 3.2 oz (60.873 kg), last menstrual period 07/06/2014. Physical Examination:  General appearance - alert, well appearing, and in no distress Chest - normal effort Heart - normal rate, regular rhythm Abdomen - gravid NT Fundal Height:  size equals dates Extremities: Homans sign is negative, no sign of DVT  Membranes:intact  Fetal Monitoring:  Baseline: 135 bpm, Variability: Good {> 6 bpm), Accelerations: Reactive and Decelerations: Absent  Labs:  Results for orders placed or performed during the hospital encounter of 02/27/15 (from the past 24 hour(s))  Glucose, capillary   Collection Time: 03/03/15  9:37 AM  Result Value Ref Range   Glucose-Capillary 88 65 - 99 mg/dL   Comment 1 Notify RN    Comment 2 Document in Chart   Glucose, capillary   Collection Time: 03/03/15  2:01 PM  Result Value Ref Range   Glucose-Capillary 97 65 - 99 mg/dL   Comment 1 Notify RN    Comment 2 Document in Chart   Glucose, capillary   Collection Time: 03/03/15  8:00 PM  Result Value Ref Range   Glucose-Capillary 102 (H) 65 - 99 mg/dL   Comment 1 Notify RN     Medications:  Scheduled . docusate sodium  100 mg Oral Daily  . glyBURIDE  2.5 mg Oral QAC supper  . nicotine  14 mg Transdermal Daily  . prenatal multivitamin   1 tablet Oral Q1200  . sodium chloride  3 mL Intravenous Q12H   I have reviewed the patient's current medications.  ASSESSMENT: Patient Active Problem List   Diagnosis Date Noted  . Gestational diabetes mellitus, antepartum 02/20/2015  . Vasa previa 02/17/2015  . Advanced maternal age in multigravida 01/06/2015    PLAN: 1. Vasa Previa No bleeding Continue modified bedrest Continue NST BID, reactive today T and Screen q 72 hours Delivery with  Maternal/fetal concerns  2. A2GDM CBG's in range Continue current glyburide regimen  Bristyl Mclees S, MD 03/04/2015,7:21 AM

## 2015-03-05 LAB — TYPE AND SCREEN
ABO/RH(D): A POS
ANTIBODY SCREEN: POSITIVE
DAT, IgG: NEGATIVE
PT AG TYPE: POSITIVE
UNIT DIVISION: 0
UNIT DIVISION: 0

## 2015-03-05 LAB — GLUCOSE, CAPILLARY
GLUCOSE-CAPILLARY: 93 mg/dL (ref 65–99)
GLUCOSE-CAPILLARY: 96 mg/dL (ref 65–99)
GLUCOSE-CAPILLARY: 89 mg/dL (ref 65–99)
Glucose-Capillary: 80 mg/dL (ref 65–99)

## 2015-03-05 NOTE — Progress Notes (Signed)
Patient ID: [name], female   DOB: 07-22-1975, 39 y.o.   MRN: [id number] Patient ID: [name], female   DOB: 1976-05-08, 39 y.o.   MRN: [id number] FACULTY PRACTICE ANTEPARTUM(COMPREHENSIVE) NOTE  [name] is a 39 y.o. [id number] at [redacted]w[redacted]d  by best clinical estimate who is admitted for vasa previa.   Fetal presentation is cephalic. Length of Stay:  6  Days  Subjective: Denies any bleeding and without complaints. Patient reports the fetal movement as active. Patient reports uterine contraction  activity as none. Patient reports  vaginal bleeding as none. Patient describes fluid per vagina as None.  Vitals:  Blood pressure 125/72, pulse 99, temperature 98.4 F (36.9 C), temperature source Oral, resp. rate 18, height  (1.626 m), weight 134 lb 3.2 oz (60.873 kg), last menstrual period 07/06/2014, SpO2 98 %. Physical Examination:  General appearance - alert, well appearing, and in no distress Chest - normal effort Heart - normal rate, regular rhythm Abdomen - gravid NT Fundal Height:  size equals dates Extremities: Homans sign is negative, no sign of DVT  Membranes:intact  Fetal Monitoring:  Baseline: 135 bpm, Variability: Good {> 6 bpm), Accelerations: Reactive and Decelerations: Absent  Labs:  Results for orders placed or performed during the hospital encounter of 02/27/15 (from the past 24 hour(s))  Glucose, capillary   Collection Time: 03/04/15  8:01 AM  Result Value Ref Range   Glucose-Capillary 80 65 - 99 mg/dL  Glucose, capillary   Collection Time: 03/04/15 10:13 AM  Result Value Ref Range   Glucose-Capillary 91 65 - 99 mg/dL  Glucose, capillary   Collection Time: 03/04/15  1:55 PM  Result Value Ref Range   Glucose-Capillary 103 (H) 65 - 99 mg/dL  Glucose, capillary   Collection Time: 03/04/15  9:51 PM  Result Value Ref Range   Glucose-Capillary 132 (H) 65 - 99 mg/dL    Medications:  Scheduled . docusate sodium  100 mg Oral Daily  . glyBURIDE  2.5  mg Oral QAC supper  . nicotine  14 mg Transdermal Daily  . prenatal multivitamin  1 tablet Oral Q1200  . sodium chloride  3 mL Intravenous Q12H   I have reviewed the patient's current medications.  ASSESSMENT: Patient Active Problem List   Diagnosis Date Noted  . Gestational diabetes mellitus, antepartum 02/20/2015  . Vasa previa 02/17/2015  . Advanced maternal age in multigravida 01/06/2015    PLAN: 1. Vasa Previa No bleeding Continue modified bedrest Continue NST BID, reactive today T and Screen q 72 hours Delivery with  Maternal/fetal concerns  2. A2GDM CBG's in range Continue current glyburide regimen  Lazaro Arms, MD 03/05/2015,7:12 AM

## 2015-03-06 LAB — CBC
HCT: 28.5 % — ABNORMAL LOW (ref 36.0–46.0)
Hemoglobin: 9.7 g/dL — ABNORMAL LOW (ref 12.0–15.0)
MCH: 31.1 pg (ref 26.0–34.0)
MCHC: 34 g/dL (ref 30.0–36.0)
MCV: 91.3 fL (ref 78.0–100.0)
PLATELETS: 271 10*3/uL (ref 150–400)
RBC: 3.12 MIL/uL — ABNORMAL LOW (ref 3.87–5.11)
RDW: 14.2 % (ref 11.5–15.5)
WBC: 16.4 10*3/uL — AB (ref 4.0–10.5)

## 2015-03-06 LAB — GLUCOSE, CAPILLARY
GLUCOSE-CAPILLARY: 96 mg/dL (ref 65–99)
GLUCOSE-CAPILLARY: 97 mg/dL (ref 65–99)
Glucose-Capillary: 74 mg/dL (ref 65–99)
Glucose-Capillary: 79 mg/dL (ref 65–99)
Glucose-Capillary: 80 mg/dL (ref 65–99)

## 2015-03-06 NOTE — Progress Notes (Signed)
Patient ID: [name], female   DOB: [birth year], 39 y.o.   MRN: [id number] FACULTY PRACTICE ANTEPARTUM(COMPREHENSIVE) NOTE  [name] is a 39 y.o. [id number] at 108w0d by best clinical estimate who is admitted for vasa previa.  Fetal presentation is cephalic. Length of Stay: 6 Days  Subjective: Denies any bleeding and without complaints. Patient reports the fetal movement as active. Patient reports uterine contraction activity as none. Patient reports vaginal bleeding as none. Patient describes fluid per vagina as None.  Vitals: Blood pressure 125/72, pulse 99, temperature 98.4 F (36.9 C), temperature source Oral, resp. rate 18, height  (1.626 m), weight 134 lb 3.2 oz (60.873 kg), last menstrual period 07/06/2014, SpO2 98 %. Physical Examination: General appearance - alert, well appearing, and in no distress Chest - normal effort Heart - normal rate, regular rhythm Abdomen - gravid NT Fundal Height: size equals dates Extremities: Homans sign is negative, no sign of DVT  Membranes:intact  Fetal Monitoring: Baseline: 135 bpm, Variability: Good {> 6 bpm), Accelerations: Reactive and Decelerations: Absent  Labs:  Results for orders placed or performed during the hospital encounter of 02/27/15 (from the past 24 hour(s))  Glucose, capillary   Collection Time: 03/04/15 8:01 AM  Result Value Ref Range   Glucose-Capillary 80 65 - 99 mg/dL  Glucose, capillary   Collection Time: 03/04/15 10:13 AM  Result Value Ref Range   Glucose-Capillary 91 65 - 99 mg/dL  Glucose, capillary   Collection Time: 03/04/15 1:55 PM  Result Value Ref Range   Glucose-Capillary 103 (H) 65 - 99 mg/dL  Glucose, capillary   Collection Time: 03/04/15 9:51 PM  Result Value Ref Range   Glucose-Capillary 132 (H) 65 - 99 mg/dL    Medications: Scheduled . docusate sodium 100 mg Oral Daily  . glyBURIDE 2.5 mg Oral QAC supper  .  nicotine 14 mg Transdermal Daily  . prenatal multivitamin 1 tablet Oral Q1200  . sodium chloride 3 mL Intravenous Q12H   I have reviewed the patient's current medications.  ASSESSMENT: Patient Active Problem List   Diagnosis Date Noted  . Gestational diabetes mellitus, antepartum 02/20/2015  . Vasa previa 02/17/2015  . Advanced maternal age in multigravida 01/06/2015    PLAN: 1. Vasa Previa No bleeding Continue modified bedrest Continue NST BID, reactive today T and Screen q 72 hours Delivery with Maternal/fetal concerns  2. A2GDM CBG's in range Continue current glyburide regimen      Haylin Camilli V 03/06/15

## 2015-03-07 LAB — GLUCOSE, CAPILLARY
GLUCOSE-CAPILLARY: 91 mg/dL (ref 65–99)
GLUCOSE-CAPILLARY: 115 mg/dL — AB (ref 65–99)
GLUCOSE-CAPILLARY: 83 mg/dL (ref 65–99)
Glucose-Capillary: 74 mg/dL (ref 65–99)

## 2015-03-07 NOTE — Progress Notes (Signed)
Patient ID: [name], female   DOB: May 15, 1976, 39 y.o.   MRN: [id number] FACULTY PRACTICE ANTEPARTUM(COMPREHENSIVE) NOTE  [name] is a 39 y.o. [id number] at [redacted]w[redacted]d  by best clinical estimate who is admitted for vasa previa.   Fetal presentation is cephalic. Length of Stay:  8  Days  Subjective: Denies any bleeding and without complaints. Patient reports the fetal movement as active. Patient reports uterine contraction  activity as none. Patient reports  vaginal bleeding as none. Patient describes fluid per vagina as None.  Vitals:  Blood pressure 117/55, pulse 92, temperature 97.8 F (36.6 C), temperature source Oral, resp. rate 18, height  (1.626 m), weight 134 lb 3.2 oz (60.873 kg), last menstrual period 07/06/2014, SpO2 95 %. Physical Examination:  General appearance - alert, well appearing, and in no distress Heart: regular rate, no murmur Lungs: clear to auscultation bilaterally, no wheezing. Abdomen - gravid NT Fundal Height:  size equals dates Extremities: Homans sign is negative, no sign of DVT  Membranes:intact  Fetal Monitoring:  Baseline: 120 bpm, Variability: Good {> 6 bpm), Accelerations: Reactive and Decelerations: Absent  Labs:  Results for orders placed or performed during the hospital encounter of 02/27/15 (from the past 24 hour(s))  Glucose, capillary   Collection Time: 03/06/15  9:54 AM  Result Value Ref Range   Glucose-Capillary 80 65 - 99 mg/dL   Comment 1 Notify RN    Comment 2 Document in Chart   Glucose, capillary   Collection Time: 03/06/15  2:06 PM  Result Value Ref Range   Glucose-Capillary 96 65 - 99 mg/dL  Glucose, capillary   Collection Time: 03/06/15  7:35 PM  Result Value Ref Range   Glucose-Capillary 97 65 - 99 mg/dL    Medications:  Scheduled . docusate sodium  100 mg Oral Daily  . glyBURIDE  2.5 mg Oral QAC supper  . nicotine  14 mg Transdermal Daily  . prenatal multivitamin  1 tablet Oral Q1200  . sodium chloride  3  mL Intravenous Q12H   I have reviewed the patient's current medications.  ASSESSMENT: Patient Active Problem List   Diagnosis Date Noted  . Gestational diabetes mellitus, antepartum 02/20/2015  . Vasa previa 02/17/2015  . Advanced maternal age in multigravida 01/06/2015    PLAN: 1. Vasa Previa No bleeding Continue modified bedrest Continue NST BID, reactive today T and Screen q 72 hours Delivery with  Maternal/fetal concerns  2. A2GDM CBG's in range Continue current glyburide regimen  Jane Heninger JEHIEL, DO 03/07/2015,7:57 AM

## 2015-03-08 LAB — GLUCOSE, CAPILLARY
GLUCOSE-CAPILLARY: 105 mg/dL — AB (ref 65–99)
GLUCOSE-CAPILLARY: 107 mg/dL — AB (ref 65–99)
Glucose-Capillary: 78 mg/dL (ref 65–99)
Glucose-Capillary: 89 mg/dL (ref 65–99)

## 2015-03-08 LAB — TYPE AND SCREEN
ABO/RH(D): A POS
ANTIBODY SCREEN: POSITIVE
DAT, IGG: NEGATIVE
Unit division: 0

## 2015-03-08 MED ORDER — CYANOCOBALAMIN 1000 MCG/ML IJ SOLN
1000.0000 ug | Freq: Once | INTRAMUSCULAR | Status: AC
  Administered 2015-03-08: 1000 ug via INTRAMUSCULAR
  Filled 2015-03-08: qty 1

## 2015-03-08 MED ORDER — CYANOCOBALAMIN 1000 MCG/ML IJ KIT: 1000.0000 ug | PACK | Freq: Once | INTRAMUSCULAR | Status: DC

## 2015-03-08 NOTE — Progress Notes (Signed)
Patient ID: [name], female   DOB: 1976-05-29, 39 y.o.   MRN: [id number] FACULTY PRACTICE ANTEPARTUM(COMPREHENSIVE) NOTE  [name] is a 39 y.o. [id number] at [redacted]w[redacted]d  by best clinical estimate who is admitted for vasa previa.   Fetal presentation is cephalic. Length of Stay:  9  Days  Subjective: Denies any bleeding and without complaints. Patient reports the fetal movement as active. Patient reports uterine contraction  activity as none. Patient reports  vaginal bleeding as none. Patient describes fluid per vagina as None.  Vitals:  Blood pressure 123/58, pulse 92, temperature 98.4 F (36.9 C), temperature source Oral, resp. rate 18, height  (1.626 m), weight 134 lb 3.2 oz (60.873 kg), last menstrual period 07/06/2014, SpO2 95 %. Physical Examination:  General appearance - alert, well appearing, and in no distress Heart: regular rate, no murmur Lungs: clear to auscultation bilaterally, no wheezing. Abdomen - gravid NT Fundal Height:  size equals dates Extremities: Homans sign is negative, no sign of DVT  Membranes:intact  Fetal Monitoring:  Baseline: 130 bpm, Variability: Good {> 6 bpm), Accelerations: Reactive, Decelerations: Absent and Toco: no contractions  Labs:  Results for orders placed or performed during the hospital encounter of 02/27/15 (from the past 24 hour(s))  Glucose, capillary   Collection Time: 03/07/15 11:05 AM  Result Value Ref Range   Glucose-Capillary 91 65 - 99 mg/dL   Comment 1 Notify RN    Comment 2 Document in Chart   Glucose, capillary   Collection Time: 03/07/15  4:46 PM  Result Value Ref Range   Glucose-Capillary 115 (H) 65 - 99 mg/dL   Comment 1 Notify RN    Comment 2 Document in Chart   Glucose, capillary   Collection Time: 03/07/15  9:28 PM  Result Value Ref Range   Glucose-Capillary 83 65 - 99 mg/dL  Glucose, capillary   Collection Time: 03/08/15  6:06 AM  Result Value Ref Range   Glucose-Capillary 78 65 - 99 mg/dL     Medications:  Scheduled . Cyanocobalamin  1,000 mcg Injection Once  . docusate sodium  100 mg Oral Daily  . glyBURIDE  2.5 mg Oral QAC supper  . nicotine  14 mg Transdermal Daily  . prenatal multivitamin  1 tablet Oral Q1200  . sodium chloride  3 mL Intravenous Q12H   I have reviewed the patient's current medications.  ASSESSMENT: Patient Active Problem List   Diagnosis Date Noted  . Gestational diabetes mellitus, antepartum 02/20/2015  . Vasa previa 02/17/2015  . Advanced maternal age in multigravida 01/06/2015    PLAN: 1. Vasa Previa No bleeding Continue modified bedrest Continue NST BID, reactive today T and Screen q 72 hours Delivery with  Maternal/fetal concerns  2. A2GDM CBG's in range Continue current glyburide regimen  Patient with vitamin B12 deficiency. She will receive her monthly dose today Continue current antepartum care  Lovelee Forner, MD 03/08/2015,9:02 AM

## 2015-03-09 LAB — CBC
HCT: 27.9 % — ABNORMAL LOW (ref 36.0–46.0)
Hemoglobin: 9.5 g/dL — ABNORMAL LOW (ref 12.0–15.0)
MCH: 31 pg (ref 26.0–34.0)
MCHC: 34.1 g/dL (ref 30.0–36.0)
MCV: 91.2 fL (ref 78.0–100.0)
Platelets: 263 10*3/uL (ref 150–400)
RBC: 3.06 MIL/uL — ABNORMAL LOW (ref 3.87–5.11)
RDW: 14.3 % (ref 11.5–15.5)
WBC: 17 10*3/uL — ABNORMAL HIGH (ref 4.0–10.5)

## 2015-03-09 LAB — GLUCOSE, CAPILLARY
GLUCOSE-CAPILLARY: 78 mg/dL (ref 65–99)
GLUCOSE-CAPILLARY: 89 mg/dL (ref 65–99)
Glucose-Capillary: 93 mg/dL (ref 65–99)
Glucose-Capillary: 94 mg/dL (ref 65–99)

## 2015-03-09 NOTE — Progress Notes (Signed)
Patient ID: [name], female   DOB: 09-Jun-1975, 39 y.o.   MRN: [id number] Patient ID: [name], female   DOB: Jul 14, 1975, 39 y.o.   MRN: [id number] FACULTY PRACTICE ANTEPARTUM(COMPREHENSIVE) NOTE  [name] is a 39 y.o. [id number] at [redacted]w[redacted]d  by best clinical estimate who is admitted for vasa previa.   Fetal presentation is cephalic. Length of Stay:  10  Days  Subjective: Denies any bleeding and without complaints. Patient reports the fetal movement as active. Patient reports uterine contraction  activity as none. Patient reports  vaginal bleeding as none. Patient describes fluid per vagina as None.  Vitals:  Blood pressure 120/59, pulse 94, temperature 98.2 F (36.8 C), temperature source Oral, resp. rate 18, height  (1.626 m), weight 138 lb 3.2 oz (62.687 kg), last menstrual period 07/06/2014, SpO2 95 %. Physical Examination:  General appearance - alert, well appearing, and in no distress Heart: regular rate, no murmur Lungs: clear to auscultation bilaterally, no wheezing. Abdomen - gravid NT Fundal Height:  size equals dates Extremities: Homans sign is negative, no sign of DVT  Membranes:intact  Fetal Monitoring:  Baseline: 130 bpm, Variability: Good {> 6 bpm), Accelerations: Reactive, Decelerations: Absent and Toco: no contractions  Labs:  Results for orders placed or performed during the hospital encounter of 02/27/15 (from the past 24 hour(s))  Type and screen   Collection Time: 03/08/15 10:20 AM  Result Value Ref Range   ABO/RH(D) A POS    Antibody Screen POS    Sample Expiration 03/11/2015    DAT, IgG NEG    Antibody Identification NO CLINICALLY SIGNIFICANT ANTIBODY IDENTIFIED    Unit Number [id number]    Blood Component Type RED CELLS,LR    Unit division 00    Status of Unit ALLOCATED    Transfusion Status OK TO TRANSFUSE    Crossmatch Result COMPATIBLE    Unit Number [id number]    Blood Component Type RED CELLS,LR    Unit division 00    Status of Unit ALLOCATED    Transfusion Status OK TO TRANSFUSE    Crossmatch Result COMPATIBLE   Glucose, capillary   Collection Time: 03/08/15 11:15 AM  Result Value Ref Range   Glucose-Capillary 89 65 - 99 mg/dL   Comment 1 Notify RN    Comment 2 Document in Chart   Glucose, capillary   Collection Time: 03/08/15  3:01 PM  Result Value Ref Range   Glucose-Capillary 105 (H) 65 - 99 mg/dL   Comment 1 Notify RN    Comment 2 Document in Chart   Glucose, capillary   Collection Time: 03/08/15  6:58 PM  Result Value Ref Range   Glucose-Capillary 107 (H) 65 - 99 mg/dL   Comment 1 Notify RN    Comment 2 Document in Chart     Medications:  Scheduled . docusate sodium  100 mg Oral Daily  . glyBURIDE  2.5 mg Oral QAC supper  . nicotine  14 mg Transdermal Daily  . prenatal multivitamin  1 tablet Oral Q1200  . sodium chloride  3 mL Intravenous Q12H   I have reviewed the patient's current medications.  ASSESSMENT: Patient Active Problem List   Diagnosis Date Noted  . Gestational diabetes mellitus, antepartum 02/20/2015  . Vasa previa 02/17/2015  . Advanced maternal age in multigravida 01/06/2015    PLAN: 1. Vasa Previa No bleeding Continue modified bedrest Continue NST BID, reactive today T and Screen q 72 hours Delivery with  Maternal/fetal concerns  2. A2GDM CBG's in range Continue current glyburide regimen  Patient with vitamin B12 deficiency. She will receive her monthly dose today Continue current antepartum care  Lazaro Arms, MD 03/09/2015,6:24 AM

## 2015-03-10 LAB — GLUCOSE, CAPILLARY
GLUCOSE-CAPILLARY: 118 mg/dL — AB (ref 65–99)
Glucose-Capillary: 86 mg/dL (ref 65–99)
Glucose-Capillary: 98 mg/dL (ref 65–99)
Glucose-Capillary: 113 mg/dL — ABNORMAL HIGH (ref 65–99)
Glucose-Capillary: 88 mg/dL (ref 65–99)

## 2015-03-10 NOTE — Progress Notes (Signed)
Patient ID: TAMATHA GADBOIS, female   DOB: 1976-05-07, 39 y.o.   MRN: [id number] FACULTY PRACTICE ANTEPARTUM(COMPREHENSIVE) NOTE  [name] is a 39 y.o. [id number] at [redacted]w[redacted]d  by best clinical estimate who is admitted for vasa previa.   Fetal presentation is cephalic. Length of Stay:  11  Days  Subjective: Denies any bleeding and without complaints. Patient reports the fetal movement as active. Patient reports uterine contraction  activity as none. Patient reports  vaginal bleeding as none. Patient describes fluid per vagina as None.  Vitals:  Blood pressure 118/59, pulse 103, temperature 98.3 F (36.8 C), temperature source Oral, resp. rate 18, height  (1.626 m), weight 138 lb 3.2 oz (62.687 kg), last menstrual period 07/06/2014, SpO2 95 %. Physical Examination:  General appearance - alert, well appearing, and in no distress Heart: regular rate, no murmur Lungs: clear to auscultation bilaterally, no wheezing. Abdomen - gravid NT Fundal Height:  size equals dates Extremities: Homans sign is negative, no sign of DVT  Membranes:intact  Fetal Monitoring:  Baseline: 120 bpm, Variability: Good {> 6 bpm), Accelerations: Reactive and Decelerations: Absent  Labs:  Results for orders placed or performed during the hospital encounter of 02/27/15 (from the past 24 hour(s))  Glucose, capillary   Collection Time: 03/09/15 12:06 PM  Result Value Ref Range   Glucose-Capillary 93 65 - 99 mg/dL   Comment 1 Document in Chart    Comment 2 Repeat Test   Glucose, capillary   Collection Time: 03/09/15  3:18 PM  Result Value Ref Range   Glucose-Capillary 94 65 - 99 mg/dL   Comment 1 Document in Chart    Comment 2 Repeat Test   Glucose, capillary   Collection Time: 03/09/15  9:13 PM  Result Value Ref Range   Glucose-Capillary 89 65 - 99 mg/dL    Medications:  Scheduled . docusate sodium  100 mg Oral Daily  . glyBURIDE  2.5 mg Oral QAC supper  . nicotine  14 mg Transdermal Daily  .  prenatal multivitamin  1 tablet Oral Q1200  . sodium chloride  3 mL Intravenous Q12H   I have reviewed the patient's current medications.  ASSESSMENT: Patient Active Problem List   Diagnosis Date Noted  . Gestational diabetes mellitus, antepartum 02/20/2015  . Vasa previa 02/17/2015  . Advanced maternal age in multigravida 01/06/2015    PLAN: 1. Vasa Previa No bleeding Continue modified bedrest Continue NST BID, reactive today T and Screen q 72 hours Delivery with  Maternal/fetal concerns  2. A2GDM CBG's in range Continue current glyburide regimen  STINSON, JACOB JEHIEL, DO 03/10/2015,7:41 AM

## 2015-03-11 LAB — TYPE AND SCREEN
ABO/RH(D): A POS
Antibody Screen: POSITIVE
DAT, IGG: NEGATIVE
Unit division: 0
Unit division: 0

## 2015-03-11 LAB — GLUCOSE, CAPILLARY
GLUCOSE-CAPILLARY: 87 mg/dL (ref 65–99)
GLUCOSE-CAPILLARY: 106 mg/dL — AB (ref 65–99)
GLUCOSE-CAPILLARY: 97 mg/dL (ref 65–99)
Glucose-Capillary: 75 mg/dL (ref 65–99)

## 2015-03-11 NOTE — Progress Notes (Signed)
Patient ID: [name], female   DOB: Apr 02, 1976, 39 y.o.   MRN: [id number]  FACULTY PRACTICE ANTEPARTUM(COMPREHENSIVE) NOTE  [name] is a 39 y.o. [id number] at [redacted]w[redacted]d  who is admitted for Vasa Previa.   Length of Stay:  12  Days  Subjective: No complaints Patient reports the fetal movement as active. Patient reports uterine contraction  activity as none. Patient reports  vaginal bleeding as none. Patient describes fluid per vagina as None.  Vitals:  Blood pressure 116/61, pulse 92, temperature 98.5 F (36.9 C), temperature source Oral, resp. rate 18, height  (1.626 m), weight 138 lb 3.2 oz (62.687 kg), last menstrual period 07/06/2014, SpO2 95 %. Physical Examination:  General appearance - alert, well appearing, and in no distress Abdomen - soft, nontender, nondistended, no masses or organomegaly Extremities - Homan's sign negative bilaterally  Fetal Monitoring:  Baseline: 120 bpm, Variability: Good {> 6 bpm), Accelerations: Reactive and Decelerations: Absent  Labs:  Results for orders placed or performed during the hospital encounter of 02/27/15 (from the past 24 hour(s))  Glucose, capillary   Collection Time: 03/10/15  8:06 AM  Result Value Ref Range   Glucose-Capillary 86 65 - 99 mg/dL   Comment 1 Notify RN    Comment 2 Document in Chart   Glucose, capillary   Collection Time: 03/10/15 10:09 AM  Result Value Ref Range   Glucose-Capillary 118 (H) 65 - 99 mg/dL  Glucose, capillary   Collection Time: 03/10/15  4:24 PM  Result Value Ref Range   Glucose-Capillary 98 65 - 99 mg/dL   Comment 1 Notify RN    Comment 2 Document in Chart   Glucose, capillary   Collection Time: 03/10/15  7:35 PM  Result Value Ref Range   Glucose-Capillary 113 (H) 65 - 99 mg/dL   Comment 1 Notify RN    Comment 2 Document in Chart   Glucose, capillary   Collection Time: 03/10/15  9:16 PM  Result Value Ref Range   Glucose-Capillary 88 65 - 99 mg/dL  Glucose, capillary   Collection  Time: 03/11/15  6:45 AM  Result Value Ref Range   Glucose-Capillary 75 65 - 99 mg/dL    Medications:  Scheduled . docusate sodium  100 mg Oral Daily  . glyBURIDE  2.5 mg Oral QAC supper  . nicotine  14 mg Transdermal Daily  . prenatal multivitamin  1 tablet Oral Q1200  . sodium chloride  3 mL Intravenous Q12H   I have reviewed the patient's current medications.  ASSESSMENT: Patient Active Problem List   Diagnosis Date Noted  . Gestational diabetes mellitus, antepartum 02/20/2015  . Vasa previa 02/17/2015  . Advanced maternal age in multigravida 01/06/2015    PLAN: Planned for in house observation until c/s at 35 weeks. Consider rpt betamethasone prior to planned preterm delivery. NST reactive.  Jane Uno H. 03/11/2015,7:22 AM

## 2015-03-12 LAB — CBC
HEMATOCRIT: 28.6 % — AB (ref 36.0–46.0)
Hemoglobin: 9.6 g/dL — ABNORMAL LOW (ref 12.0–15.0)
MCH: 30.9 pg (ref 26.0–34.0)
MCHC: 33.6 g/dL (ref 30.0–36.0)
MCV: 92 fL (ref 78.0–100.0)
PLATELETS: 268 10*3/uL (ref 150–400)
RBC: 3.11 MIL/uL — ABNORMAL LOW (ref 3.87–5.11)
RDW: 14.4 % (ref 11.5–15.5)
WBC: 14.4 10*3/uL — AB (ref 4.0–10.5)

## 2015-03-12 LAB — GLUCOSE, CAPILLARY
GLUCOSE-CAPILLARY: 82 mg/dL (ref 65–99)
GLUCOSE-CAPILLARY: 105 mg/dL — AB (ref 65–99)
Glucose-Capillary: 104 mg/dL — ABNORMAL HIGH (ref 65–99)
Glucose-Capillary: 102 mg/dL — ABNORMAL HIGH (ref 65–99)

## 2015-03-12 NOTE — Progress Notes (Signed)
Patient ID: [name], female   DOB: Nov 19, 1975, 39 y.o.   MRN: [id number]  FACULTY PRACTICE ANTEPARTUM(COMPREHENSIVE) NOTE  CHARLSIE FLEEGER is a 39 y.o. [id number] at [redacted]w[redacted]d  who is admitted for Vasa Previa.   Length of Stay:  13  Days  Subjective: No complaints Patient reports the fetal movement as active. Patient reports uterine contraction  activity as none. Patient reports  vaginal bleeding as none. Patient describes fluid per vagina as None.  Vitals:  Blood pressure 104/85, pulse 100, temperature 98.5 F (36.9 C), temperature source Oral, resp. rate 18, height  (1.626 m), weight 138 lb 3.2 oz (62.687 kg), last menstrual period 07/06/2014, SpO2 95 %. Physical Examination:  General appearance - alert, well appearing, and in no distress Abdomen - soft, nontender, nondistended, no masses or organomegaly Extremities - Homan's sign negative bilaterally  Fetal Monitoring:  Baseline: 120 bpm, Variability: Good {> 6 bpm), Accelerations: Reactive and Decelerations: Absent  Labs:  Results for orders placed or performed during the hospital encounter of 02/27/15 (from the past 24 hour(s))  Glucose, capillary   Collection Time: 03/11/15  9:51 AM  Result Value Ref Range   Glucose-Capillary 87 65 - 99 mg/dL   Comment 1 Notify RN   Type and screen   Collection Time: 03/11/15 11:20 AM  Result Value Ref Range   ABO/RH(D) A POS    Antibody Screen POS    Sample Expiration 03/14/2015    Antibody Identification NO CLINICALLY SIGNIFICANT ANTIBODY IDENTIFIED    DAT, IgG NEG    Unit Number [id number]    Blood Component Type RBC LR PHER1    Unit division 00    Status of Unit ALLOCATED    Transfusion Status OK TO TRANSFUSE    Crossmatch Result COMPATIBLE    Unit Number [id number]    Blood Component Type RED CELLS,LR    Unit division 00    Status of Unit ALLOCATED    Transfusion Status OK TO TRANSFUSE    Crossmatch Result COMPATIBLE   Glucose, capillary   Collection Time:  03/11/15  3:31 PM  Result Value Ref Range   Glucose-Capillary 106 (H) 65 - 99 mg/dL   Comment 1 Notify RN   Glucose, capillary   Collection Time: 03/11/15  7:36 PM  Result Value Ref Range   Glucose-Capillary 97 65 - 99 mg/dL    Medications:  Scheduled . docusate sodium  100 mg Oral Daily  . glyBURIDE  2.5 mg Oral QAC supper  . nicotine  14 mg Transdermal Daily  . prenatal multivitamin  1 tablet Oral Q1200  . sodium chloride  3 mL Intravenous Q12H   I have reviewed the patient's current medications.  ASSESSMENT: Patient Active Problem List   Diagnosis Date Noted  . Gestational diabetes mellitus, antepartum 02/20/2015  . Vasa previa 02/17/2015  . Advanced maternal age in multigravida 01/06/2015    PLAN: Planned for in house observation until c/s at 35 weeks. Consider rpt betamethasone prior to planned preterm delivery. CBG well controlled. Continue current glyburide dose NST reactive.  Orbie Grupe 03/12/2015,6:45 AM

## 2015-03-13 DIAGNOSIS — O24419 Gestational diabetes mellitus in pregnancy, unspecified control: Secondary | ICD-10-CM

## 2015-03-13 DIAGNOSIS — O09523 Supervision of elderly multigravida, third trimester: Secondary | ICD-10-CM

## 2015-03-13 LAB — GLUCOSE, CAPILLARY
GLUCOSE-CAPILLARY: 80 mg/dL (ref 65–99)
GLUCOSE-CAPILLARY: 107 mg/dL — AB (ref 65–99)
GLUCOSE-CAPILLARY: 105 mg/dL — AB (ref 65–99)
Glucose-Capillary: 75 mg/dL (ref 65–99)

## 2015-03-13 NOTE — Progress Notes (Signed)
Patient ID: [name], female   DOB: 26-Feb-1976, 39 y.o.   MRN: [id number] FACULTY PRACTICE ANTEPARTUM(COMPREHENSIVE) NOTE  [name] is a 39 y.o. [id number] at [redacted]w[redacted]d  who is admitted for vasa previa.   Length of Stay:  14  Days  Subjective: Patient reports the fetal movement as active. Patient reports uterine contraction  activity as none. Patient reports  vaginal bleeding as none. Patient describes fluid per vagina as None.  Vitals:  Blood pressure 111/57, pulse 89, temperature 98.6 F (37 C), temperature source Oral, resp. rate 18, height  (1.626 m), weight 138 lb 3.2 oz (62.687 kg), last menstrual period 07/06/2014, SpO2 95 %. Physical Examination:  General appearance - alert, well appearing, and in no distress Abdomen - soft, nontender, nondistended, no masses or organomegaly Extremities - Homan's sign negative bilaterally  Fetal Monitoring:  Baseline: 130 bpm, Variability: Good {> 6 bpm), Accelerations: Reactive and Decelerations: Absent  Labs:  Results for orders placed or performed during the hospital encounter of 02/27/15 (from the past 24 hour(s))  Glucose, capillary   Collection Time: 03/12/15  2:07 PM  Result Value Ref Range   Glucose-Capillary 104 (H) 65 - 99 mg/dL   Comment 1 Notify RN   Glucose, capillary   Collection Time: 03/12/15  8:13 PM  Result Value Ref Range   Glucose-Capillary 102 (H) 65 - 99 mg/dL  Glucose, capillary   Collection Time: 03/13/15  7:53 AM  Result Value Ref Range   Glucose-Capillary 80 65 - 99 mg/dL  Glucose, capillary   Collection Time: 03/13/15 10:09 AM  Result Value Ref Range   Glucose-Capillary 107 (H) 65 - 99 mg/dL   Comment 1 Notify RN    Comment 2 Document in Chart     Imaging Studies:    Growth [place] at MFm Tuesday  Medications:  Scheduled . docusate sodium  100 mg Oral Daily  . glyBURIDE  2.5 mg Oral QAC supper  . nicotine  14 mg Transdermal Daily  . prenatal multivitamin  1 tablet Oral Q1200  . sodium chloride   3 mL Intravenous Q12H   I have reviewed the patient's current medications.  ASSESSMENT: Patient Active Problem List   Diagnosis Date Noted  . Gestational diabetes mellitus, antepartum 02/20/2015  . Vasa previa 02/17/2015  . Advanced maternal age in multigravida 01/06/2015    PLAN: Rpt betamethasone prior to delivery Scheduled for c/s at 35 weeks.  Anesthesia (Dr. Arby Barrette) aware and concurs with this date.  OR C/S staff and Amy Skrinjar aware and prepared. F/u growth [place] Euglycemia--cont CBG and meds as prescribed  [name] H. 03/13/2015,11:01 AM

## 2015-03-14 ENCOUNTER — Encounter (HOSPITAL_COMMUNITY): Payer: Self-pay | Admitting: *Deleted

## 2015-03-14 ENCOUNTER — Inpatient Hospital Stay (HOSPITAL_COMMUNITY): Payer: Medicaid Other

## 2015-03-14 LAB — TYPE AND SCREEN
ABO/RH(D): A POS
ANTIBODY SCREEN: POSITIVE
DAT, IGG: NEGATIVE
Unit division: 0
Unit division: 0

## 2015-03-14 LAB — GLUCOSE, CAPILLARY
GLUCOSE-CAPILLARY: 88 mg/dL (ref 65–99)
GLUCOSE-CAPILLARY: 82 mg/dL (ref 65–99)
Glucose-Capillary: 90 mg/dL (ref 65–99)
Glucose-Capillary: 94 mg/dL (ref 65–99)

## 2015-03-14 MED ORDER — BETAMETHASONE SOD PHOS & ACET 6 (3-3) MG/ML IJ SUSP
12.0000 mg | INTRAMUSCULAR | Status: AC
  Administered 2015-03-16 – 2015-03-17 (×2): 12 mg via INTRAMUSCULAR
  Filled 2015-03-14 (×2): qty 2

## 2015-03-14 NOTE — Progress Notes (Signed)
Had a long visit with patient who is excited that her time in the hospital is drawing to a close because she is scheduled for a c-section on Sunday.  She says that she is also going to have some vessels operated on and is trying to trust the doctors who assure her it should be a relatively easy procedure.  Baby Arna Medici is estimated between 4-5 pounds and needs to be 5 lbs to avoid going to the NICU.  Pt stated she is really hoping for weight gain in the next few days.  We discussed her hopes for delivery, the construction on her house, and the positive ways she is coping with the stress of being away from home and her children and also dealing with the medical complications including gestational diabetes.  Pt is appreciative of support.  Will continue to follow.  Please page as needs arise.  Janetta Hora [place], [place] [phone]    03/14/15 1509  Clinical Encounter Type  Visited With Patient  Visit Type Follow-up;Spiritual support;Social support

## 2015-03-14 NOTE — Progress Notes (Signed)
Patient ID: [name], female   DOB: April 09, 1976, 39 y.o.   MRN: [id number] FACULTY PRACTICE ANTEPARTUM(COMPREHENSIVE) NOTE  [name] is a 39 y.o. [id number] at [redacted]w[redacted]d  by best clinical estimate who is admitted for vasa previa.   Fetal presentation is cephalic. Length of Stay:  15  Days  Subjective: Denies any bleeding and without complaints. Patient reports the fetal movement as active. Patient reports uterine contraction  activity as none. Patient reports  vaginal bleeding as none. Patient describes fluid per vagina as None.  Vitals:  Blood pressure 121/65, pulse 97, temperature 98 F (36.7 C), temperature source Oral, resp. rate 20, height  (1.626 m), weight 138 lb 3.2 oz (62.687 kg), last menstrual period 07/06/2014, SpO2 95 %. Physical Examination:  General appearance - alert, well appearing, and in no distress Heart: regular rate, no murmur Lungs: clear to auscultation bilaterally, no wheezing. Abdomen - gravid NT Fundal Height:  size equals dates Extremities: Homans sign is negative, no sign of DVT  Membranes:intact  Fetal Monitoring:  Baseline: 120 bpm, Variability: Good {> 6 bpm), Accelerations: Reactive and Decelerations: Absent  Labs:  Results for orders placed or performed during the hospital encounter of 02/27/15 (from the past 24 hour(s))  Glucose, capillary   Collection Time: 03/13/15  7:53 AM  Result Value Ref Range   Glucose-Capillary 80 65 - 99 mg/dL  Glucose, capillary   Collection Time: 03/13/15 10:09 AM  Result Value Ref Range   Glucose-Capillary 107 (H) 65 - 99 mg/dL   Comment 1 Notify RN    Comment 2 Document in Chart   Glucose, capillary   Collection Time: 03/13/15  2:05 PM  Result Value Ref Range   Glucose-Capillary 105 (H) 65 - 99 mg/dL   Comment 1 Notify RN    Comment 2 Document in Chart   Glucose, capillary   Collection Time: 03/13/15 10:48 PM  Result Value Ref Range   Glucose-Capillary 75 65 - 99 mg/dL    Medications:   Scheduled . docusate sodium  100 mg Oral Daily  . glyBURIDE  2.5 mg Oral QAC supper  . nicotine  14 mg Transdermal Daily  . prenatal multivitamin  1 tablet Oral Q1200  . sodium chloride  3 mL Intravenous Q12H   I have reviewed the patient's current medications.  ASSESSMENT: Patient Active Problem List   Diagnosis Date Noted  . Gestational diabetes mellitus, antepartum 02/20/2015  . Vasa previa 02/17/2015  . Advanced maternal age in multigravida 01/06/2015    PLAN: 1. Vasa Previa No bleeding Continue modified bedrest Continue NST BID, reactive today T and Screen q 72 hours Delivery with  Maternal/fetal concerns Planned section at 35 weeks Patient already crossmatched 2 units Repeat BMZ on 10/14&10/15  2. A2GDM CBG's in range Continue current glyburide regimen  STINSON, JACOB JEHIEL, DO 03/14/2015,7:46 AM

## 2015-03-14 NOTE — Anesthesia Preprocedure Evaluation (Addendum)
Anesthesia Evaluation  Patient identified by MRN, date of birth, ID band Patient awake    Reviewed: Allergy & Precautions, NPO status , Patient's Chart, lab work & pertinent test results  Airway Mallampati: II  TM Distance: >3 FB Neck ROM: Full    Dental no notable dental hx. (+) Teeth Intact   Pulmonary Current Smoker,    Pulmonary exam normal breath sounds clear to auscultation       Cardiovascular negative cardio ROS Normal cardiovascular exam Rhythm:Regular Rate:Normal     Neuro/Psych negative neurological ROS  negative psych ROS   GI/Hepatic Neg liver ROS,   Endo/Other  diabetes, Well Controlled, Gestational, Oral Hypoglycemic Agents  Renal/GU negative Renal ROS  negative genitourinary   Musculoskeletal negative musculoskeletal ROS (+)   Abdominal   Peds  Hematology  (+) anemia , Hx/o Pernicious Anemia   Anesthesia Other Findings Pregnancy - pt with vasa previa and "low lying placenta", no evidence of placenta previa or accreta Platelets and allergies reviewed Denies active cardiac or pulmonary symptoms, METS > 4  Denies blood thinning medications, bleeding disorders, hypertension, asthma, supine hypotension syndrome, previous anesthesia difficulties    Reproductive/Obstetrics (+) Pregnancy 34 weeks 2 days Vasa previa                            Anesthesia Physical Anesthesia Plan  ASA: II  Anesthesia Plan: Spinal   Post-op Pain Management:    Induction: Intravenous  Airway Management Planned: Natural Airway  Additional Equipment:   Intra-op Plan:   Post-operative Plan:   Informed Consent: I have reviewed the patients History and Physical, chart, labs and discussed the procedure including the risks, benefits and alternatives for the proposed anesthesia with the patient or authorized representative who has indicated his/her understanding and acceptance.   Dental  advisory given  Plan Discussed with: CRNA, Anesthesiologist and Surgeon  Anesthesia Plan Comments:         Anesthesia Quick Evaluation

## 2015-03-15 DIAGNOSIS — B353 Tinea pedis: Secondary | ICD-10-CM

## 2015-03-15 LAB — CBC
HCT: 26.8 % — ABNORMAL LOW (ref 36.0–46.0)
HEMOGLOBIN: 9.2 g/dL — AB (ref 12.0–15.0)
MCH: 31.5 pg (ref 26.0–34.0)
MCHC: 34.3 g/dL (ref 30.0–36.0)
MCV: 91.8 fL (ref 78.0–100.0)
PLATELETS: 291 10*3/uL (ref 150–400)
RBC: 2.92 MIL/uL — AB (ref 3.87–5.11)
RDW: 14.4 % (ref 11.5–15.5)
WBC: 17 10*3/uL — ABNORMAL HIGH (ref 4.0–10.5)

## 2015-03-15 LAB — GLUCOSE, CAPILLARY
GLUCOSE-CAPILLARY: 78 mg/dL (ref 65–99)
GLUCOSE-CAPILLARY: 116 mg/dL — AB (ref 65–99)
GLUCOSE-CAPILLARY: 96 mg/dL (ref 65–99)

## 2015-03-15 MED ORDER — TERBINAFINE HCL 1 % EX CREA
TOPICAL_CREAM | Freq: Two times a day (BID) | CUTANEOUS | Status: DC
  Administered 2015-03-15 – 2015-03-18 (×7): via TOPICAL
  Filled 2015-03-15 (×2): qty 12

## 2015-03-15 NOTE — Progress Notes (Signed)
Patient ID: [name], female   DOB: 1975-11-20, 39 y.o.   MRN: [id number] FACULTY PRACTICE ANTEPARTUM(COMPREHENSIVE) NOTE  [name] is a 39 y.o. [id number] at [redacted]w[redacted]d by best clinical estimate who is admitted for vasa previa.   Fetal presentation is cephalic. Length of Stay:  16  Days  Subjective: Doing well. Reports itchy right foot which has not responded to OTC antifungal. Patient reports the fetal movement as active. Patient reports uterine contraction  activity as none. Patient reports  vaginal bleeding as none. Patient describes fluid per vagina as None.  Vitals:  Blood pressure 120/67, pulse 91, temperature 98 F (36.7 C), temperature source Oral, resp. rate 18, height  (1.626 m), weight 138 lb 3.2 oz (62.687 kg), last menstrual period 07/06/2014, SpO2 99 %. Physical Examination:  General appearance - alert, well appearing, and in no distress Neck - supple, no significant adenopathy Chest - normal effort Abdomen - gravid, NT Extremities: Homans sign is negative, no sign of DVT  Membranes:intact  Fetal Monitoring:  Baseline: 120 bpm, Variability: Good {> 6 bpm), Accelerations: Reactive and Decelerations: Absent  Labs:  Results for orders placed or performed during the hospital encounter of 02/27/15 (from the past 24 hour(s))  Glucose, capillary   Collection Time: 03/14/15  8:15 AM  Result Value Ref Range   Glucose-Capillary 88 65 - 99 mg/dL   Comment 1 Notify RN    Comment 2 Document in Chart   Type and screen   Collection Time: 03/14/15 10:23 AM  Result Value Ref Range   ABO/RH(D) A POS    Antibody Screen NEG    Sample Expiration 03/17/2015    Unit Number [id number]    Blood Component Type RBC LR PHER1    Unit division 00    Status of Unit ALLOCATED    Transfusion Status OK TO TRANSFUSE    Crossmatch Result Compatible    Unit Number [id number]    Blood Component Type RED CELLS,LR    Unit division 00    Status of Unit ALLOCATED    Transfusion  Status OK TO TRANSFUSE    Crossmatch Result Compatible   Glucose, capillary   Collection Time: 03/14/15 11:04 AM  Result Value Ref Range   Glucose-Capillary 90 65 - 99 mg/dL  Glucose, capillary   Collection Time: 03/14/15  4:31 PM  Result Value Ref Range   Glucose-Capillary 94 65 - 99 mg/dL   Comment 1 Notify RN    Comment 2 Document in Chart   Glucose, capillary   Collection Time: 03/14/15  8:54 PM  Result Value Ref Range   Glucose-Capillary 82 65 - 99 mg/dL  Glucose, capillary   Collection Time: 03/15/15  5:43 AM  Result Value Ref Range   Glucose-Capillary 78 65 - 99 mg/dL    Medications:  Scheduled . [START ON 03/17/2015] betamethasone acetate-betamethasone sodium phosphate  12 mg Intramuscular Q24H  . docusate sodium  100 mg Oral Daily  . glyBURIDE  2.5 mg Oral QAC supper  . nicotine  14 mg Transdermal Daily  . prenatal multivitamin  1 tablet Oral Q1200  . sodium chloride  3 mL Intravenous Q12H   I have reviewed the patient's current medications.  ASSESSMENT/PLAN: 1. Vasa Previa (stable) - continue inpatient hospitalization with bedrest  Plan for delivery on Sunday this week at 35 wks  NICU consult today  Repeat BMZ Friday and Saturday  2. A2DM (stable) - BS are well controlled, 78-94 yesterday  Continue diet and Glyburide for  management  3. Tinea Pedis (new) - topical antifungal  Makaelyn Aponte S, MD 03/15/2015,7:36 AM

## 2015-03-16 LAB — GLUCOSE, CAPILLARY
GLUCOSE-CAPILLARY: 109 mg/dL — AB (ref 65–99)
GLUCOSE-CAPILLARY: 121 mg/dL — AB (ref 65–99)
Glucose-Capillary: 81 mg/dL (ref 65–99)
Glucose-Capillary: 105 mg/dL — ABNORMAL HIGH (ref 65–99)
Glucose-Capillary: 77 mg/dL (ref 65–99)

## 2015-03-16 NOTE — Progress Notes (Signed)
Patient ID: [name] [place], female   DOB: [birth year], 39 y.o.   MRN: [id number] FACULTY PRACTICE ANTEPARTUM(COMPREHENSIVE) NOTE  [name] Hufstedler is a 39 y.o. [id number] at 936w4d b who is admitted for vasa previa.   Fetal presentation is cephalic. Length of Stay:  17  Days  Subjective: Pt stable this 24 hrs no complaints Patient reports the fetal movement as active. Patient reports uterine contraction  activity as none. Patient reports  vaginal bleeding as none. Patient describes fluid per vagina as None.  Vitals:  Blood pressure 125/67, pulse 89, temperature 97.8 F (36.6 C), temperature source Oral, resp. rate 18, height 5\' 4"  (1.626 m), weight 62.823 kg (138 lb 8 oz), last menstrual period 07/06/2014, SpO2 97 %. Physical Examination:  General appearance - alert, well appearing, and in no distress Heart - normal rate and regular rhythm Abdomen - soft, nontender, nondistended Fundal Height:  size equals dates Cervical Exam: Not evaluated. anMembranes:intact  Fetal Monitoring:  Done bid   Labs:  Results for orders placed or performed during the hospital encounter of 02/27/15 (from the past 24 hour(s))  CBC   Collection Time: 03/15/15  8:43 AM  Result Value Ref Range   WBC 17.0 (H) 4.0 - 10.5 Lynch/uL   RBC 2.92 (L) 3.87 - 5.11 MIL/uL   Hemoglobin 9.2 (L) 12.0 - 15.0 g/dL   HCT 14.726.8 (L) 82.936.0 - 56.246.0 %   MCV 91.8 78.0 - 100.0 fL   MCH 31.5 26.0 - 34.0 pg   MCHC 34.3 30.0 - 36.0 g/dL   RDW 13.014.4 86.511.5 - 78.415.5 %   Platelets 291 150 - 400 Lynch/uL  Glucose, capillary   Collection Time: 03/15/15  1:41 PM  Result Value Ref Range   Glucose-Capillary 116 (H) 65 - 99 mg/dL   Comment 1 Notify RN    Comment 2 Document in Chart   Glucose, capillary   Collection Time: 03/15/15 10:05 PM  Result Value Ref Range   Glucose-Capillary 96 65 - 99 mg/dL    Imaging Studies:     Currently EPIC will not allow sonographic studies to automatically populate into notes.  In the meantime, copy and paste results  into note or free text.  Medications:  Scheduled . [START ON 03/17/2015] betamethasone acetate-betamethasone sodium phosphate  12 mg Intramuscular Q24H  . docusate sodium  100 mg Oral Daily  . glyBURIDE  2.5 mg Oral QAC supper  . nicotine  14 mg Transdermal Daily  . prenatal multivitamin  1 tablet Oral Q1200  . sodium chloride  3 mL Intravenous Q12H  . terbinafine   Topical BID   I have reviewed the patient's current medications.  ASSESSMENT: Patient Active Problem List   Diagnosis Date Noted  . Gestational diabetes mellitus, antepartum 02/20/2015  . Vasa previa 02/17/2015  . Advanced maternal age in multigravida 01/06/2015    PLAN:ASSESSMENT/PLAN: 1. Vasa Previa (stable) - continue inpatient hospitalization with bedrest Plan for delivery on Sunday this week at 35 wks NICU consult today Repeat BMZ Friday and Saturday  2. A2DM (stable) - BS are well controlled, 78-94 yesterday Continue diet and Glyburide for management  3. Tinea Pedis (new) - topical antifungal    Hillarie Harrigan V 03/16/2015,7:39 AM

## 2015-03-17 LAB — GLUCOSE, CAPILLARY
GLUCOSE-CAPILLARY: 126 mg/dL — AB (ref 65–99)
GLUCOSE-CAPILLARY: 122 mg/dL — AB (ref 65–99)
Glucose-Capillary: 131 mg/dL — ABNORMAL HIGH (ref 65–99)
Glucose-Capillary: 101 mg/dL — ABNORMAL HIGH (ref 65–99)

## 2015-03-17 LAB — TYPE AND SCREEN
ABO/RH(D): A POS
Antibody Screen: NEGATIVE
UNIT DIVISION: 0
UNIT DIVISION: 0

## 2015-03-17 NOTE — Consult Note (Signed)
Neonatology Consult to Antenatal Patient:  I was asked by Dr. Macon LargeAnyanwu to see this patient in order to provide antenatal counseling due to vasa previa and anticipated delivery on 10/16.  [name] was admitted on 9/26 and is now 4534 4/[redacted] weeks GA. She received PNC in [place] and her EDC per LMP was 04/12/15. She was referred here for amniocentesis due to advanced maternal age and, on ultrasound exam, the vasa previa was identified. [facility], so exact GA may be between 34 6/7 and 35 3/7 weeks on the anticipated C/S date (10/16). She is currently not having active labor. She is getting a second course of BMZ. She is a GDM, on diet and Glyburide, and she is getting daily Nicotine transdermally.  I spoke with the patient alone. We discussed what to expect regarding the baby's care, including usual DR management, possible respiratory complications and need for support, IV access, feedings (mother desires breast feeding, which was encouraged), and LOS. She was most concerned about criteria for admission to the NICU and understands that a determination will be made by the neonatologist attending the C-section, with a > 50% chance that the baby will require admission to NICU, either directly from the DR or subsequently from the central nursery, due to issues related to the baby's prematurity. I also explained that GDM, even well-controlled, is often associated with delayed lung maturity and hypoglycemia. I would be glad to come back if she has more questions later.  Thank you for asking me to see this patient.  Doretha Souhristie C. Zoran Yankee, MD Neonatologist  The total length of face-to-face or floor/unit time for this encounter was 25 minutes. Counseling and/or coordination of care was 15 minutes of the above.

## 2015-03-17 NOTE — Progress Notes (Signed)
Called NICU charge and spoke with Vernona RiegerLaura to advise of NEO/NICU consult.

## 2015-03-17 NOTE — Progress Notes (Signed)
Patient ID: Grace IsaacMelinda K Lowden, female   DOB: [birth year], 39 y.o.   MRN: [id number] FACULTY PRACTICE ANTEPARTUM(COMPREHENSIVE) NOTE  Grace IsaacMelinda K Corporan is a 39 y.o. [id number] at 5319w4d who is admitted for vasa previa.   Fetal presentation is cephalic. Length of Stay:  18  Days  Subjective: No complaints Patient reports the fetal movement as active. Patient reports uterine contraction  activity as none. Patient reports  vaginal bleeding as none. Patient describes fluid per vagina as None.  Vitals:  Blood pressure 118/69, pulse 99, temperature 98.4 F (36.9 C), temperature source Oral, resp. rate 16, height 5\' 4"  (1.626 m), weight 138 lb 8 oz (62.823 kg), last menstrual period 07/06/2014, SpO2 98 %. Physical Examination: General appearance - alert, well appearing, and in no distress Heart - normal rate and regular rhythm Abdomen - soft, nontender, nondistended Fundal Height: size equals dates Cervical Exam: Not evaluated.  Extremities: extremities normal, atraumatic, no cyanosis or edema and Homans sign is negative, no sign of DVT with DTRs 2+ bilaterally Membranes:intact  Fetal Monitoring:  RNST bid   Labs:  Results for orders placed or performed during the hospital encounter of 02/27/15 (from the past 24 hour(s))  Glucose, capillary   Collection Time: 03/16/15  8:10 AM  Result Value Ref Range   Glucose-Capillary 81 65 - 99 mg/dL   Comment 1 Notify RN    Comment 2 Document in Chart   Glucose, capillary   Collection Time: 03/16/15 10:21 AM  Result Value Ref Range   Glucose-Capillary 105 (H) 65 - 99 mg/dL   Comment 1 Notify RN    Comment 2 Document in Chart   Glucose, capillary   Collection Time: 03/16/15  4:03 PM  Result Value Ref Range   Glucose-Capillary 77 65 - 99 mg/dL   Comment 1 Notify RN    Comment 2 Document in Chart   Glucose, capillary   Collection Time: 03/16/15  7:22 PM  Result Value Ref Range   Glucose-Capillary 109 (H) 65 - 99 mg/dL   Comment 1 Notify RN    Comment 2 Document in Chart   Glucose, capillary   Collection Time: 03/16/15 11:20 PM  Result Value Ref Range   Glucose-Capillary 121 (H) 65 - 99 mg/dL  Glucose, capillary   Collection Time: 03/17/15  6:47 AM  Result Value Ref Range   Glucose-Capillary 131 (H) 65 - 99 mg/dL    Medications:  Scheduled . betamethasone acetate-betamethasone sodium phosphate  12 mg Intramuscular Q24H  . docusate sodium  100 mg Oral Daily  . glyBURIDE  2.5 mg Oral QAC supper  . nicotine  14 mg Transdermal Daily  . prenatal multivitamin  1 tablet Oral Q1200  . sodium chloride  3 mL Intravenous Q12H  . terbinafine   Topical BID   I have reviewed the patient's current medications.  ASSESSMENT: Patient Active Problem List   Diagnosis Date Noted  . Gestational diabetes mellitus, antepartum 02/20/2015  . Vasa previa 02/17/2015  . Advanced maternal age in multigravida 01/06/2015    PLAN: 1. Vasa Previa (stable) - continue inpatient hospitalization with bedrest Plan for delivery on Sunday this week at 35 wks NICU consulted today; Dr. Katrinka BlazingSmith called this morning Repeat BMZ course ongoing  2. A2DM (stable) - BS are well controlled Continue diet and Glyburide for management  3. Tinea Pedis (new) - topical antifungal  4. Routine antenatal care    [facility] LLCNYANWU,[name] A, MD 03/17/2015,8:03 AM

## 2015-03-18 LAB — GLUCOSE, CAPILLARY
GLUCOSE-CAPILLARY: 136 mg/dL — AB (ref 65–99)
Glucose-Capillary: 151 mg/dL — ABNORMAL HIGH (ref 65–99)
Glucose-Capillary: 98 mg/dL (ref 65–99)
Glucose-Capillary: 120 mg/dL — ABNORMAL HIGH (ref 65–99)

## 2015-03-18 LAB — CBC
HCT: 27.1 % — ABNORMAL LOW (ref 36.0–46.0)
Hemoglobin: 9.2 g/dL — ABNORMAL LOW (ref 12.0–15.0)
MCH: 31.3 pg (ref 26.0–34.0)
MCHC: 33.9 g/dL (ref 30.0–36.0)
MCV: 92.2 fL (ref 78.0–100.0)
PLATELETS: 325 10*3/uL (ref 150–400)
RBC: 2.94 MIL/uL — AB (ref 3.87–5.11)
RDW: 14.4 % (ref 11.5–15.5)
WBC: 23.6 10*3/uL — ABNORMAL HIGH (ref 4.0–10.5)

## 2015-03-18 NOTE — Progress Notes (Signed)
FACULTY PRACTICE ANTEPARTUM(COMPREHENSIVE) NOTE  Jane IsaacMelinda K Lynch is a 39 y.o. [id number] at 7115w6d by early ultrasound who is admitted for vasa previa.   Fetal presentation is cephalic. Length of Stay:  19  Days  Subjective:  Patient reports the fetal movement as active. Patient reports uterine contraction  activity as none. Patient reports  vaginal bleeding as none. Patient describes fluid per vagina as None.  Vitals:  Blood pressure 130/62, pulse 111, temperature 98.2 F (36.8 C), temperature source Oral, resp. rate 18, height 5\' 4"  (1.626 m), weight 62.823 kg (138 lb 8 oz), last menstrual period 07/06/2014, SpO2 96 %. Physical Examination:  General appearance - alert, well appearing, and in no distress Heart - normal rate and regular rhythm Abdomen - gravid Fundal Height:  size equals dates Cervical Exam: Not evaluated. Extremities: extremities normal, atraumatic, no cyanosis or edema and Homans sign is negative, no sign of DVT with DTRs 2+ bilaterally Membranes:intact  Fetal Monitoring:     Fetal Heart Rate A      Mode  External filed at 03/17/2015 2144    Baseline Rate (A)  115 bpm filed at 03/17/2015 2225    Variability  6-25 BPM filed at 03/17/2015 2225    Accelerations  15 x 15 filed at 03/17/2015 2225    Decelerations  None filed at 03/17/2015 2225        Labs:  Results for orders placed or performed during the hospital encounter of 02/27/15 (from the past 24 hour(s))  Type and screen   Collection Time: 03/17/15 11:30 AM  Result Value Ref Range   ABO/RH(D) A POS    Antibody Screen NEG    Sample Expiration 03/20/2015    Unit Number [id number]    Blood Component Type RED CELLS,LR    Unit division 00    Status of Unit ALLOCATED    Transfusion Status OK TO TRANSFUSE    Crossmatch Result Compatible    Unit Number [id number]    Blood Component Type RED CELLS,LR    Unit division 00    Status of Unit ALLOCATED    Transfusion Status OK TO TRANSFUSE    Crossmatch Result Compatible    Unit Number [id number]    Blood Component Type RBC LR PHER1    Unit division 00    Status of Unit ALLOCATED    Transfusion Status OK TO TRANSFUSE    Crossmatch Result Compatible    Unit Number [id number]    Blood Component Type RED CELLS,LR    Unit division 00    Status of Unit ALLOCATED    Transfusion Status OK TO TRANSFUSE    Crossmatch Result Compatible    Unit Number [id number]    Blood Component Type RED CELLS,LR    Unit division 00    Status of Unit ALLOCATED    Transfusion Status OK TO TRANSFUSE    Crossmatch Result Compatible    Unit Number [id number]    Blood Component Type RED CELLS,LR    Unit division 00    Status of Unit ALLOCATED    Transfusion Status OK TO TRANSFUSE    Crossmatch Result Compatible    Unit Number [id number]    Blood Component Type RED CELLS,LR    Unit division 00    Status of Unit ALLOCATED    Transfusion Status OK TO TRANSFUSE    Crossmatch Result Compatible    Unit Number [id number]    Blood Component Type RED CELLS,LR    Unit division 00  Status of Unit ALLOCATED    Transfusion Status OK TO TRANSFUSE    Crossmatch Result Compatible    Unit Number [id number]    Blood Component Type RBC LR PHER2    Unit division 00    Status of Unit ALLOCATED    Transfusion Status OK TO TRANSFUSE    Crossmatch Result Compatible    Unit Number [id number]    Blood Component Type RED CELLS,LR    Unit division 00    Status of Unit ALLOCATED    Transfusion Status OK TO TRANSFUSE    Crossmatch Result Compatible    Unit Number [id number]    Blood Component Type RED CELLS,LR    Unit division 00    Status of Unit ALLOCATED    Transfusion Status OK TO TRANSFUSE    Crossmatch Result Compatible    Unit Number [id number]    Blood Component Type RED CELLS,LR    Unit division 00    Status of Unit ALLOCATED    Transfusion Status OK TO TRANSFUSE    Crossmatch Result Compatible   Glucose,  capillary   Collection Time: 03/17/15  2:04 PM  Result Value Ref Range   Glucose-Capillary 122 (H) 65 - 99 mg/dL   Comment 1 Notify RN    Comment 2 Document in Chart   Glucose, capillary   Collection Time: 03/17/15  8:01 PM  Result Value Ref Range   Glucose-Capillary 101 (H) 65 - 99 mg/dL  CBC   Collection Time: 03/18/15  8:00 AM  Result Value Ref Range   WBC 23.6 (H) 4.0 - 10.5 K/uL   RBC 2.94 (L) 3.87 - 5.11 MIL/uL   Hemoglobin 9.2 (L) 12.0 - 15.0 g/dL   HCT 72.5 (L) 36.6 - 44.0 %   MCV 92.2 78.0 - 100.0 fL   MCH 31.3 26.0 - 34.0 pg   MCHC 33.9 30.0 - 36.0 g/dL   RDW 34.7 42.5 - 95.6 %   Platelets 325 150 - 400 K/uL  Glucose, capillary   Collection Time: 03/18/15  8:15 AM  Result Value Ref Range   Glucose-Capillary 136 (H) 65 - 99 mg/dL   Comment 1 Notify RN   Glucose, capillary   Collection Time: 03/18/15 10:25 AM  Result Value Ref Range   Glucose-Capillary 151 (H) 65 - 99 mg/dL   Comment 1 Notify RN       Medications:  Scheduled . docusate sodium  100 mg Oral Daily  . glyBURIDE  2.5 mg Oral QAC supper  . nicotine  14 mg Transdermal Daily  . prenatal multivitamin  1 tablet Oral Q1200  . sodium chloride  3 mL Intravenous Q12H  . terbinafine   Topical BID   I have reviewed the patient's current medications.  ASSESSMENT: Patient Active Problem List   Diagnosis Date Noted  . Gestational diabetes mellitus, antepartum 02/20/2015  . Vasa previa 02/17/2015  . Advanced maternal age in multigravida 01/06/2015  BG elevated after BMZ CS scheduled tomorrow  PLAN: CS tomorrow 35 weeks  ARNOLD,JAMES 03/18/2015,11:28 AM

## 2015-03-19 ENCOUNTER — Encounter (HOSPITAL_COMMUNITY)
Admission: AD | Disposition: A | Discharge status: Home / Self Care | Payer: Self-pay | Source: Ambulatory Visit | Attending: Family Medicine

## 2015-03-19 ENCOUNTER — Inpatient Hospital Stay (HOSPITAL_COMMUNITY): Payer: Medicaid Other | Admitting: Anesthesiology

## 2015-03-19 ENCOUNTER — Encounter (HOSPITAL_COMMUNITY): Payer: Self-pay

## 2015-03-19 DIAGNOSIS — Z98891 History of uterine scar from previous surgery: Secondary | ICD-10-CM

## 2015-03-19 LAB — PREPARE RBC (CROSSMATCH)

## 2015-03-19 LAB — GLUCOSE, CAPILLARY
GLUCOSE-CAPILLARY: 134 mg/dL — AB (ref 65–99)
Glucose-Capillary: 102 mg/dL — ABNORMAL HIGH (ref 65–99)

## 2015-03-19 SURGERY — Surgical Case
Anesthesia: Spinal | Site: Abdomen

## 2015-03-19 MED ORDER — LACTATED RINGERS IV SOLN
INTRAVENOUS | Status: DC
  Administered 2015-03-19: 19:00:00 via INTRAVENOUS

## 2015-03-19 MED ORDER — NALBUPHINE HCL 10 MG/ML IJ SOLN: 5.0000 mg | Freq: Once | INTRAMUSCULAR | Status: DC | PRN

## 2015-03-19 MED ORDER — DEXAMETHASONE SODIUM PHOSPHATE 4 MG/ML IJ SOLN
INTRAMUSCULAR | Status: AC
  Filled 2015-03-19: qty 1

## 2015-03-19 MED ORDER — NALOXONE HCL 1 MG/ML IJ SOLN
1.0000 ug/kg/h | INTRAVENOUS | Status: DC | PRN
  Filled 2015-03-19: qty 2

## 2015-03-19 MED ORDER — BUPIVACAINE HCL (PF) 0.5 % IJ SOLN
INTRAMUSCULAR | Status: AC
  Filled 2015-03-19: qty 30

## 2015-03-19 MED ORDER — LACTATED RINGERS IV SOLN
40.0000 [IU] | INTRAVENOUS | Status: DC | PRN
Start: 2015-03-19 — End: 2015-03-19
  Administered 2015-03-19: 40 [IU] via INTRAVENOUS

## 2015-03-19 MED ORDER — BUPIVACAINE HCL (PF) 0.5 % IJ SOLN
INTRAMUSCULAR | Status: DC | PRN
  Administered 2015-03-19: 30 mL

## 2015-03-19 MED ORDER — DEXAMETHASONE SODIUM PHOSPHATE 4 MG/ML IJ SOLN
INTRAMUSCULAR | Status: DC | PRN
  Administered 2015-03-19: 4 mg via INTRAVENOUS

## 2015-03-19 MED ORDER — ONDANSETRON HCL 4 MG/2ML IJ SOLN: 4.0000 mg | Freq: Three times a day (TID) | INTRAMUSCULAR | Status: DC | PRN

## 2015-03-19 MED ORDER — ONDANSETRON HCL 4 MG/2ML IJ SOLN
INTRAMUSCULAR | Status: AC
  Filled 2015-03-19: qty 2

## 2015-03-19 MED ORDER — LANOLIN HYDROUS EX OINT
1.0000 | TOPICAL_OINTMENT | CUTANEOUS | Status: DC | PRN
Start: 2015-03-19 — End: 2015-03-21

## 2015-03-19 MED ORDER — CITRIC ACID-SODIUM CITRATE 334-500 MG/5ML PO SOLN
ORAL | Status: AC
  Administered 2015-03-19: 30 mL
  Filled 2015-03-19: qty 15

## 2015-03-19 MED ORDER — DIPHENHYDRAMINE HCL 25 MG PO CAPS
25.0000 mg | ORAL_CAPSULE | ORAL | Status: DC | PRN
  Administered 2015-03-20: 25 mg via ORAL
  Filled 2015-03-19: qty 1

## 2015-03-19 MED ORDER — SCOPOLAMINE 1 MG/3DAYS TD PT72: 1.0000 | MEDICATED_PATCH | Freq: Once | TRANSDERMAL | Status: DC

## 2015-03-19 MED ORDER — TETANUS-DIPHTH-ACELL PERTUSSIS 5-2.5-18.5 LF-MCG/0.5 IM SUSP
0.5000 mL | Freq: Once | INTRAMUSCULAR | Status: AC
  Administered 2015-03-20: 0.5 mL via INTRAMUSCULAR
  Filled 2015-03-19: qty 0.5

## 2015-03-19 MED ORDER — LACTATED RINGERS IV SOLN
INTRAVENOUS | Status: DC
  Administered 2015-03-19: 08:00:00 via INTRAVENOUS

## 2015-03-19 MED ORDER — SODIUM CHLORIDE 0.9 % IR SOLN
Status: DC | PRN
  Administered 2015-03-19: 1000 mL

## 2015-03-19 MED ORDER — OXYCODONE-ACETAMINOPHEN 5-325 MG PO TABS
1.0000 | ORAL_TABLET | ORAL | Status: DC | PRN
  Filled 2015-03-19 (×2): qty 1

## 2015-03-19 MED ORDER — SIMETHICONE 80 MG PO CHEW
80.0000 mg | CHEWABLE_TABLET | ORAL | Status: DC
  Administered 2015-03-19: 80 mg via ORAL
  Filled 2015-03-19 (×2): qty 1

## 2015-03-19 MED ORDER — MEASLES, MUMPS & RUBELLA VAC ~~LOC~~ INJ
0.5000 mL | INJECTION | Freq: Once | SUBCUTANEOUS | Status: DC
  Filled 2015-03-19: qty 0.5

## 2015-03-19 MED ORDER — MORPHINE SULFATE (PF) 0.5 MG/ML IJ SOLN
INTRAMUSCULAR | Status: DC | PRN
  Administered 2015-03-19: .8 mg via EPIDURAL
  Administered 2015-03-19: .2 mg via INTRATHECAL
  Administered 2015-03-19: 1 mg via EPIDURAL

## 2015-03-19 MED ORDER — OXYCODONE-ACETAMINOPHEN 5-325 MG PO TABS
2.0000 | ORAL_TABLET | ORAL | Status: DC | PRN
  Administered 2015-03-19 – 2015-03-21 (×9): 2 via ORAL
  Filled 2015-03-19 (×8): qty 2

## 2015-03-19 MED ORDER — ZOLPIDEM TARTRATE 5 MG PO TABS: 5.0000 mg | ORAL_TABLET | Freq: Every evening | ORAL | Status: DC | PRN

## 2015-03-19 MED ORDER — KETOROLAC TROMETHAMINE 30 MG/ML IJ SOLN
INTRAMUSCULAR | Status: AC
  Administered 2015-03-19: 30 mg
  Filled 2015-03-19: qty 1

## 2015-03-19 MED ORDER — SODIUM CHLORIDE 0.9 % IJ SOLN: 3.0000 mL | INTRAMUSCULAR | Status: DC | PRN

## 2015-03-19 MED ORDER — DIPHENHYDRAMINE HCL 25 MG PO CAPS: 25.0000 mg | ORAL_CAPSULE | Freq: Four times a day (QID) | ORAL | Status: DC | PRN

## 2015-03-19 MED ORDER — PHENYLEPHRINE 8 MG IN D5W 100 ML (0.08MG/ML) PREMIX OPTIME
INJECTION | INTRAVENOUS | Status: DC | PRN
  Administered 2015-03-19: 60 ug/min via INTRAVENOUS

## 2015-03-19 MED ORDER — MEPERIDINE HCL 25 MG/ML IJ SOLN: 6.2500 mg | INTRAMUSCULAR | Status: DC | PRN

## 2015-03-19 MED ORDER — PROMETHAZINE HCL 25 MG/ML IJ SOLN: 6.2500 mg | INTRAMUSCULAR | Status: DC | PRN

## 2015-03-19 MED ORDER — ACETAMINOPHEN 325 MG PO TABS: 650.0000 mg | ORAL_TABLET | ORAL | Status: DC | PRN

## 2015-03-19 MED ORDER — LACTATED RINGERS IV SOLN
INTRAVENOUS | Status: DC | PRN
  Administered 2015-03-19: 09:00:00 via INTRAVENOUS

## 2015-03-19 MED ORDER — OXYTOCIN 40 UNITS IN LACTATED RINGERS INFUSION - SIMPLE MED: 62.5000 mL/h | INTRAVENOUS | Status: AC

## 2015-03-19 MED ORDER — FERROUS SULFATE 325 (65 FE) MG PO TABS
325.0000 mg | ORAL_TABLET | Freq: Two times a day (BID) | ORAL | Status: DC
Start: 2015-03-19 — End: 2015-03-21
  Administered 2015-03-19 – 2015-03-21 (×3): 325 mg via ORAL
  Filled 2015-03-19 (×4): qty 1

## 2015-03-19 MED ORDER — PRENATAL MULTIVITAMIN CH
1.0000 | ORAL_TABLET | Freq: Every day | ORAL | Status: DC
  Administered 2015-03-20 – 2015-03-21 (×2): 1 via ORAL
  Filled 2015-03-19 (×2): qty 1

## 2015-03-19 MED ORDER — SCOPOLAMINE 1 MG/3DAYS TD PT72
MEDICATED_PATCH | TRANSDERMAL | Status: DC | PRN
  Administered 2015-03-19: 1 via TRANSDERMAL

## 2015-03-19 MED ORDER — NALBUPHINE HCL 10 MG/ML IJ SOLN
5.0000 mg | INTRAMUSCULAR | Status: DC | PRN
  Administered 2015-03-19: 5 mg via INTRAVENOUS
  Filled 2015-03-19: qty 1

## 2015-03-19 MED ORDER — DIPHENHYDRAMINE HCL 50 MG/ML IJ SOLN
12.5000 mg | INTRAMUSCULAR | Status: DC | PRN
  Administered 2015-03-19: 12.5 mg via INTRAVENOUS
  Filled 2015-03-19: qty 1

## 2015-03-19 MED ORDER — FENTANYL CITRATE (PF) 100 MCG/2ML IJ SOLN
25.0000 ug | INTRAMUSCULAR | Status: DC | PRN
  Administered 2015-03-19: 25 ug via INTRAVENOUS

## 2015-03-19 MED ORDER — NALBUPHINE HCL 10 MG/ML IJ SOLN: 5.0000 mg | INTRAMUSCULAR | Status: DC | PRN

## 2015-03-19 MED ORDER — SENNOSIDES-DOCUSATE SODIUM 8.6-50 MG PO TABS
2.0000 | ORAL_TABLET | ORAL | Status: DC
Start: 2015-03-20 — End: 2015-03-21
  Administered 2015-03-19 – 2015-03-20 (×2): 2 via ORAL
  Filled 2015-03-19 (×3): qty 2

## 2015-03-19 MED ORDER — MENTHOL 3 MG MT LOZG: 1.0000 | LOZENGE | OROMUCOSAL | Status: DC | PRN

## 2015-03-19 MED ORDER — DIBUCAINE 1 % RE OINT: 1.0000 "application " | TOPICAL_OINTMENT | RECTAL | Status: DC | PRN

## 2015-03-19 MED ORDER — CEFAZOLIN SODIUM-DEXTROSE 2-3 GM-% IV SOLR
2.0000 g | INTRAVENOUS | Status: DC
  Filled 2015-03-19: qty 50

## 2015-03-19 MED ORDER — BUPIVACAINE IN DEXTROSE 0.75-8.25 % IT SOLN
INTRATHECAL | Status: DC | PRN
  Administered 2015-03-19: 1.7 mL via INTRATHECAL

## 2015-03-19 MED ORDER — MAGNESIUM HYDROXIDE 400 MG/5ML PO SUSP: 30.0000 mL | ORAL | Status: DC | PRN

## 2015-03-19 MED ORDER — ONDANSETRON HCL 4 MG/2ML IJ SOLN
INTRAMUSCULAR | Status: DC | PRN
  Administered 2015-03-19: 4 mg via INTRAVENOUS

## 2015-03-19 MED ORDER — SCOPOLAMINE 1 MG/3DAYS TD PT72
MEDICATED_PATCH | TRANSDERMAL | Status: AC
  Filled 2015-03-19: qty 1

## 2015-03-19 MED ORDER — ERYTHROMYCIN 5 MG/GM OP OINT
TOPICAL_OINTMENT | OPHTHALMIC | Status: AC
  Filled 2015-03-19: qty 1

## 2015-03-19 MED ORDER — NALOXONE HCL 0.4 MG/ML IJ SOLN: 0.4000 mg | INTRAMUSCULAR | Status: DC | PRN

## 2015-03-19 MED ORDER — WITCH HAZEL-GLYCERIN EX PADS: 1.0000 "application " | MEDICATED_PAD | CUTANEOUS | Status: DC | PRN

## 2015-03-19 MED ORDER — KETOROLAC TROMETHAMINE 30 MG/ML IJ SOLN: 30.0000 mg | Freq: Four times a day (QID) | INTRAMUSCULAR | Status: AC | PRN

## 2015-03-19 MED ORDER — CEFAZOLIN SODIUM-DEXTROSE 2-3 GM-% IV SOLR
INTRAVENOUS | Status: DC | PRN
  Administered 2015-03-19: 2 g via INTRAVENOUS

## 2015-03-19 MED ORDER — FENTANYL CITRATE (PF) 100 MCG/2ML IJ SOLN
INTRAMUSCULAR | Status: DC | PRN
  Administered 2015-03-19: 55 ug via INTRAVENOUS
  Administered 2015-03-19: 35 ug via INTRAVENOUS
  Administered 2015-03-19: 10 ug via INTRATHECAL

## 2015-03-19 MED ORDER — ACETAMINOPHEN 500 MG PO TABS
1000.0000 mg | ORAL_TABLET | Freq: Four times a day (QID) | ORAL | Status: AC
  Administered 2015-03-19: 1000 mg via ORAL
  Filled 2015-03-19: qty 2

## 2015-03-19 MED ORDER — FENTANYL CITRATE (PF) 100 MCG/2ML IJ SOLN
INTRAMUSCULAR | Status: AC
  Filled 2015-03-19: qty 4

## 2015-03-19 MED ORDER — PHENYLEPHRINE 8 MG IN D5W 100 ML (0.08MG/ML) PREMIX OPTIME
INJECTION | INTRAVENOUS | Status: AC
  Filled 2015-03-19: qty 100

## 2015-03-19 MED ORDER — FENTANYL CITRATE (PF) 100 MCG/2ML IJ SOLN
INTRAMUSCULAR | Status: AC
  Filled 2015-03-19: qty 2

## 2015-03-19 MED ORDER — IBUPROFEN 600 MG PO TABS
600.0000 mg | ORAL_TABLET | Freq: Four times a day (QID) | ORAL | Status: DC
  Administered 2015-03-19 – 2015-03-21 (×7): 600 mg via ORAL
  Filled 2015-03-19 (×8): qty 1

## 2015-03-19 MED ORDER — MORPHINE SULFATE (PF) 0.5 MG/ML IJ SOLN
INTRAMUSCULAR | Status: AC
  Filled 2015-03-19: qty 100

## 2015-03-19 MED ORDER — SIMETHICONE 80 MG PO CHEW
80.0000 mg | CHEWABLE_TABLET | ORAL | Status: DC | PRN
  Administered 2015-03-20: 80 mg via ORAL

## 2015-03-19 MED ORDER — LACTATED RINGERS IV SOLN
INTRAVENOUS | Status: DC | PRN
  Administered 2015-03-19 (×2): via INTRAVENOUS

## 2015-03-19 SURGICAL SUPPLY — 29 items
CLAMP CORD UMBIL (MISCELLANEOUS) ×3 IMPLANT
CLOTH BEACON ORANGE TIMEOUT ST (SAFETY) ×3 IMPLANT
DRAPE SHEET LG 3/4 BI-LAMINATE (DRAPES) ×3 IMPLANT
DRSG OPSITE POSTOP 4X10 (GAUZE/BANDAGES/DRESSINGS) ×3 IMPLANT
DURAPREP 26ML APPLICATOR (WOUND CARE) ×3 IMPLANT
ELECT REM PT RETURN 9FT ADLT (ELECTROSURGICAL) ×3
ELECTRODE REM PT RTRN 9FT ADLT (ELECTROSURGICAL) ×1 IMPLANT
GLOVE BIOGEL PI IND STRL 7.0 (GLOVE) ×2 IMPLANT
GLOVE BIOGEL PI INDICATOR 7.0 (GLOVE) ×4
GLOVE ECLIPSE 7.0 STRL STRAW (GLOVE) ×3 IMPLANT
GOWN STRL REUS W/TWL LRG LVL3 (GOWN DISPOSABLE) ×6 IMPLANT
NEEDLE HYPO 22GX1.5 SAFETY (NEEDLE) ×3 IMPLANT
NEEDLE HYPO 25X5/8 SAFETYGLIDE (NEEDLE) ×3 IMPLANT
NS IRRIG 1000ML POUR BTL (IV SOLUTION) ×3 IMPLANT
PACK C SECTION WH (CUSTOM PROCEDURE TRAY) ×3 IMPLANT
PAD ABD 7.5X8 STRL (GAUZE/BANDAGES/DRESSINGS) ×3 IMPLANT
PAD OB MATERNITY 4.3X12.25 (PERSONAL CARE ITEMS) ×3 IMPLANT
PENCIL SMOKE EVAC W/HOLSTER (ELECTROSURGICAL) ×3 IMPLANT
RTRCTR C-SECT PINK 25CM LRG (MISCELLANEOUS) ×3 IMPLANT
SPONGE GAUZE 4X4 12PLY STER LF (GAUZE/BANDAGES/DRESSINGS) ×6 IMPLANT
SUT PLAIN 2 0 XLH (SUTURE) IMPLANT
SUT VIC AB 0 CT1 36 (SUTURE) ×6 IMPLANT
SUT VIC AB 0 CTX 36 (SUTURE) ×4
SUT VIC AB 0 CTX36XBRD ANBCTRL (SUTURE) ×2 IMPLANT
SUT VIC AB 4-0 KS 27 (SUTURE) ×3 IMPLANT
SYR CONTROL 10ML LL (SYRINGE) ×3 IMPLANT
TAPE CLOTH SURG 4X10 WHT LF (GAUZE/BANDAGES/DRESSINGS) ×3 IMPLANT
TOWEL OR 17X24 6PK STRL BLUE (TOWEL DISPOSABLE) ×3 IMPLANT
TRAY FOLEY CATH SILVER 14FR (SET/KITS/TRAYS/PACK) ×3 IMPLANT

## 2015-03-19 NOTE — Transfer of Care (Signed)
Immediate Anesthesia Transfer of Care Note  Patient: [name]  Procedure(s) Performed: Procedure(s) with comments: CESAREAN SECTION (N/A) - vasa previa  Patient Location: PACU  Anesthesia Type:Spinal  Level of Consciousness: awake, alert  and oriented  Airway & Oxygen Therapy: Patient Spontanous Breathing  Post-op Assessment: Report given to RN and Post -op Vital signs reviewed and stable  Post vital signs: Reviewed and stable  Last Vitals:  Filed Vitals:   03/19/15 1044  BP: 112/51  Pulse: 80  Temp: 36.4 C  Resp: 20    Complications: No apparent anesthesia complications

## 2015-03-19 NOTE — Anesthesia Procedure Notes (Signed)
Spinal Patient location during procedure: OR Staffing Anesthesiologist: Haile Bosler Performed by: anesthesiologist  Preanesthetic Checklist Completed: patient identified, site marked, surgical consent, pre-op evaluation, timeout performed, IV checked, risks and benefits discussed and monitors and equipment checked Spinal Block Patient position: sitting Prep: DuraPrep Patient monitoring: heart rate, continuous pulse ox and blood pressure Location: L4-5 Injection technique: single-shot Needle Needle type: Pencan  Needle gauge: 24 G Needle length: 9 cm Assessment Sensory level: T4 Additional Notes Functioning IV was confirmed and monitors were applied. Sterile prep and drape, including hand hygiene, mask and sterile gloves were used. The patient was positioned and the spine was prepped. The skin was anesthetized with lidocaine.  Free flow of clear CSF was obtained prior to injecting local anesthetic into the CSF.  The spinal needle aspirated freely following injection.  The needle was carefully withdrawn.  The patient tolerated the procedure well. Consent was obtained prior to procedure with all questions answered and concerns addressed.  Ben Izekiel Flegel, MD    

## 2015-03-19 NOTE — Anesthesia Postprocedure Evaluation (Signed)
  Anesthesia Post-op Note  Patient: Jane IsaacMelinda K Wissinger  Procedure(s) Performed: Procedure(s) (LRB): CESAREAN SECTION (N/A)  Patient Location: PACU  Anesthesia Type: Spinal  Level of Consciousness: awake and alert   Airway and Oxygen Therapy: Patient Spontanous Breathing  Post-op Pain: mild  Post-op Assessment: Post-op Vital signs reviewed, Patient's Cardiovascular Status Stable, Respiratory Function Stable, Patent Airway and No signs of Nausea or vomiting  Last Vitals:  Filed Vitals:   03/19/15 1044  BP: 112/51  Pulse: 80  Temp: 36.4 C  Resp: 20    Post-op Vital Signs: stable   Complications: No apparent anesthesia complications

## 2015-03-19 NOTE — Op Note (Signed)
[name] PROCEDURE DATE: 03/19/2015  PREOPERATIVE DIAGNOSES: Intrauterine pregnancy at [redacted]w[redacted]d weeks gestation; vasa previa; gestational diabetes  POSTOPERATIVE DIAGNOSES: The same  PROCEDURE: Primary Low Transverse Cesarean Section  SURGEON:  Dr. Jaynie Lynch  ANESTHESIOLOGIST: Dr. Karlyne Greenspan  INDICATIONS: [name] is a 39 y.o. [id number] at [redacted]w[redacted]d here for scheduled cesarean section secondary to the indications listed under preoperative diagnoses; please see preoperative note for further details.  The risks of cesarean section were discussed with the patient including but were not limited to: bleeding which may require transfusion or reoperation; infection which may require antibiotics; injury to bowel, bladder, ureters or other surrounding organs; injury to the fetus; need for additional procedures including hysterectomy in the event of a life-threatening hemorrhage; placental abnormalities wth subsequent pregnancies, incisional problems, thromboembolic phenomenon and other postoperative/anesthesia complications.   The patient concurred with the proposed plan, giving informed written consent for the procedure.    FINDINGS:  Viable female infant in cephalic presentation.  Apgars 7 and 8, weight 5 lbs 1 oz in OR.  Clear amniotic fluid.  Intact placenta, three vessel cord. No problem with placental delivery.  Normal uterus, fallopian tubes and ovaries bilaterally.  ANESTHESIA: Spinal INTRAVENOUS FLUIDS: 2900 ml ESTIMATED BLOOD LOSS: 800 ml URINE OUTPUT:  300 ml SPECIMENS: Placenta sent to pathology COMPLICATIONS: None immediate  PROCEDURE IN DETAIL:  The patient preoperatively received intravenous antibiotics and had sequential compression devices applied to her lower extremities.  She was then taken to the operating room where spinal anesthesia was administered and was found to be adequate. She was then placed in a dorsal supine position with a leftward tilt, and prepped and  draped in a sterile manner.  A foley catheter was placed into her bladder and attached to constant gravity.  After an adequate timeout was performed, a Pfannenstiel skin incision was made with scalpel and carried through to the underlying layer of fascia. The fascia was incised in the midline, and this incision was extended bilaterally using the Mayo scissors.  Kocher clamps were applied to the superior aspect of the fascial incision and the underlying rectus muscles were dissected off bluntly.  A similar process was carried out on the inferior aspect of the fascial incision. The rectus muscles were separated in the midline bluntly and the peritoneum was entered bluntly. Attention was turned to the lower uterine segment where a low transverse hysterotomy was made with a scalpel and extended bilaterally bluntly.  The infant was successfully delivered, the cord was clamped and cut after one minute, and the infant was handed over to the awaiting neonatology team. Uterine massage was then administered, and the placenta delivered intact with a three-vessel cord. The uterus was then cleared of clot and debris.  The hysterotomy was closed with 0 Vicryl in a running locked fashion, and an imbricating layer was also placed with 0 Vicryl.  Figure-of-eight 0 Vicryl serosal stitches were placed to help with hemostasis.  The pelvis was cleared of all clot and debris. Hemostasis was confirmed on all surfaces.  The peritoneum and the muscles were reapproximated using 0 Vicryl interrupted stitches. The fascia was then closed using 0 Vicryl in a running fashion.  The subcutaneous layer was irrigated, and 30 ml of 0.5% Marcaine was injected subcutaneously around the incision.  The skin was closed with a 4-0 Vicryl subcuticular stitch. The patient tolerated the procedure well. Sponge, lap, instrument and needle counts were correct x 3.  She was taken to the recovery room in  stable condition.    Jane CollinsUGONNA  Jane Kaylor, MD, FACOG Attending  Obstetrician & Gynecologist Faculty Practice, [facility]

## 2015-03-19 NOTE — Progress Notes (Signed)
Patient ID: [name], female   DOB: 1975/07/04, 39 y.o.   MRN: [id number] FACULTY PRACTICE ANTEPARTUM(COMPREHENSIVE) NOTE  [name] is a 39 y.o. [id number] at 1025w0d who is admitted for vasa previa. Undergoing cesarean delivery today.   Fetal presentation is cephalic.  Length of Stay:  20  Days  Subjective: No complaints Patient reports the fetal movement as active. Patient reports uterine contraction  activity as none. Patient reports  vaginal bleeding as none. Patient describes fluid per vagina as None.  Vitals:  Blood pressure 120/61, pulse 89, temperature 98.2 F (36.8 C), temperature source Oral, resp. rate 18, height 5\' 4"  (1.626 m), weight 138 lb 8 oz (62.823 kg), last menstrual period 07/06/2014, SpO2 96 %. Physical Examination: General appearance - alert, well appearing, and in no distress Heart - normal rate and regular rhythm Abdomen - soft, nontender, nondistended Fundal Height: size equals dates Cervical Exam: Not evaluated.  Extremities: extremities normal, atraumatic, no cyanosis or edema and Homans sign is negative, no sign of DVT with DTRs 2+ bilaterally Membranes:intact  Fetal Monitoring:  RNST bid   Labs:  Results for orders placed or performed during the hospital encounter of 02/27/15 (from the past 24 hour(s))  Glucose, capillary   Collection Time: 03/18/15 10:25 AM  Result Value Ref Range   Glucose-Capillary 151 (H) 65 - 99 mg/dL   Comment 1 Notify RN   Glucose, capillary   Collection Time: 03/18/15  5:46 PM  Result Value Ref Range   Glucose-Capillary 98 65 - 99 mg/dL   Comment 1 Notify RN   Glucose, capillary   Collection Time: 03/18/15  9:36 PM  Result Value Ref Range   Glucose-Capillary 120 (H) 65 - 99 mg/dL  Prepare fresh frozen plasma   Collection Time: 03/19/15  2:30 AM  Result Value Ref Range   Unit Number [id number]    Blood Component Type THAWED PLASMA    Unit division 00    Status of Unit ALLOCATED    Transfusion Status  OK TO TRANSFUSE    Unit Number [id number]    Blood Component Type THW PLS APHR    Unit division A0    Status of Unit ALLOCATED    Transfusion Status OK TO TRANSFUSE   Glucose, capillary   Collection Time: 03/19/15  6:29 AM  Result Value Ref Range   Glucose-Capillary 134 (H) 65 - 99 mg/dL    Medications:  Scheduled .  ceFAZolin (ANCEF) IV  2 g Intravenous On Call to OR  . citric acid-sodium citrate      . docusate sodium  100 mg Oral Daily  . glyBURIDE  2.5 mg Oral QAC supper  . nicotine  14 mg Transdermal Daily  . prenatal multivitamin  1 tablet Oral Q1200  . sodium chloride  3 mL Intravenous Q12H  . terbinafine   Topical BID   I have reviewed the patient's current medications.  ASSESSMENT: Patient Active Problem List   Diagnosis Date Noted  . Gestational diabetes mellitus, antepartum 02/20/2015  . Vasa previa 02/17/2015  . Advanced maternal age in multigravida 01/06/2015    PLAN: Scheduled cesarean delivery today, received betamethasone course and has been seen by Neonatology.  The risks of cesarean section discussed with the patient included but were not limited to: bleeding which may require transfusion or reoperation; infection which may require antibiotics; injury to bowel, bladder, ureters or other surrounding organs; injury to the fetus; need for additional procedures including hysterectomy in the event of a life-threatening  hemorrhage; placental abnormalities wth subsequent pregnancies, incisional problems, thromboembolic phenomenon and other postoperative/anesthesia complications. The patient concurred with the proposed plan, giving informed written consent for the procedure.   Patient has been NPO since midnight she will remain NPO for procedure. Anesthesia and OR aware. Preoperative prophylactic antibiotics and SCDs ordered on call to the OR.  To OR when ready.   Amere Bricco A, MD 03/19/2015,8:42 AM

## 2015-03-20 ENCOUNTER — Encounter (HOSPITAL_COMMUNITY): Payer: Self-pay | Admitting: Obstetrics & Gynecology

## 2015-03-20 LAB — CBC
HEMATOCRIT: 22.6 % — AB (ref 36.0–46.0)
Hemoglobin: 7.6 g/dL — ABNORMAL LOW (ref 12.0–15.0)
MCH: 31.1 pg (ref 26.0–34.0)
MCHC: 33.6 g/dL (ref 30.0–36.0)
MCV: 92.6 fL (ref 78.0–100.0)
Platelets: 313 10*3/uL (ref 150–400)
RBC: 2.44 MIL/uL — ABNORMAL LOW (ref 3.87–5.11)
RDW: 14.4 % (ref 11.5–15.5)
WBC: 22.9 10*3/uL — ABNORMAL HIGH (ref 4.0–10.5)

## 2015-03-20 LAB — RPR: RPR Ser Ql: NONREACTIVE

## 2015-03-20 LAB — GLUCOSE, CAPILLARY: Glucose-Capillary: 91 mg/dL (ref 65–99)

## 2015-03-20 NOTE — Addendum Note (Signed)
Addendum  created 03/20/15 0752 by Junious SilkMelinda Delonte Musich, CRNA   Modules edited: Notes Section   Notes Section:  File: [id number]

## 2015-03-20 NOTE — Lactation Note (Signed)
This note was copied from the chart of [name]. Lactation Consultation Note Experienced BF mom BF her 40 yr old for 1 1/2 yrs and her 40 yr old for 1 yr. With both children had difficulty latching right after birth and caused pain to nipples d/t large nipples. Mom states this baby barely gets on then pushes off. Mainly lick and learn.  Mom stated that with her 1st child her breast was a "C" cup when she first got pregnant and then went to a "Lynch" cup because the baby was eating all the time and mom pumping afterwards to increase her milk supply then over done it.  Moms breast are saggy w/stretch marks and dimpling to lower inner and outer aspects of both breast. Asked mom how long has her breast had the dimpling, stated for a long time. Feels like edema when compressed tissue to those areas. You can feel lobes in breast. Encouraged mom to massage breast and compress behind areola. Mom denied breast pain when compressed. Hand expression demonstrated colostrum. Hand expressed 1ml thick yellow colostrum. Gave to baby with gloved finger and syring along with suck training. Noted baby clamping down at times during sucking.  Mom has extra large nipples which maybe a concern if baby will be able to actively transfer milk d/t 35 weeker and small mouth. Explained to mom that she may have to pump and bottle until baby grows into nipple. Gave #30 & 36 flanges for pump. Encouraged mom to pump every 3 hrs. Then hand express afterwards and give baby her colostrum. Put baby to breast prior to pumping. LPI information sheet given and reviewed about LPI feeding patterns and behavior. Mom encouraged to feed baby 8-12 times/24 hours and with feeding cues. Wake baby and stimulate for feedings. Mom will be supplementing w/Alementum as well. Mom shown how to use DEBP & how to disassemble, clean, & reassemble parts. Mom knows to pump q3h for 15-20 min. Referred to Baby and Me Book in Breastfeeding section Pg. 22-23 for  position options and Proper latch demonstration.Encouraged to call for assistance if needed and to verify proper latch.WH/LC brochure given w/resources, support groups and LC services. Mom asked me to look at her personal pump. Has a new Medela pump. Patient Name: [name] ZOXWR'UToday's Date: 03/20/2015 Reason for consult: Initial assessment   Maternal Data Has patient been taught Hand Expression?: Yes Does the patient have breastfeeding experience prior to this delivery?: Yes  Feeding Feeding Type: Breast Milk  LATCH Score/Interventions Intervention(s): Breast massage;Breast compression  Intervention(s): Skin to skin;Hand expression  Type of Nipple: Everted at rest and after stimulation  Comfort (Breast/Nipple): Soft / non-tender     Intervention(s): Breastfeeding basics reviewed;Support Pillows;Position options;Skin to skin     Lactation Tools Discussed/Used Tools: Pump;Flanges Flange Size: 30 (30 + 36) Breast pump type: Double-Electric Breast Pump Pump Review: Setup, frequency, and cleaning;Milk Storage Initiated by:: RN Date initiated:: 03/19/15   Consult Status Consult Status: Follow-up Date: 03/21/15 Follow-up type: In-patient    [name], Jane NickelLAURA Lynch 03/20/2015, 2:39 AM

## 2015-03-20 NOTE — Anesthesia Postprocedure Evaluation (Signed)
  Anesthesia Post-op Note  Patient: [name]  Procedure(s) Performed: Procedure(s) with comments: CESAREAN SECTION (N/A) - vasa previa  Patient Location: Mother/Baby  Anesthesia Type:Spinal  Level of Consciousness: awake, alert  and oriented  Airway and Oxygen Therapy: Patient Spontanous Breathing  Post-op Pain: none  Post-op Assessment: Post-op Vital signs reviewed, Patient's Cardiovascular Status Stable, Respiratory Function Stable, Patent Airway, No signs of Nausea or vomiting, Adequate PO intake, Pain level controlled and No headache              Post-op Vital Signs: Reviewed and stable  Last Vitals:  Filed Vitals:   03/20/15 0409  BP: 101/55  Pulse: 72  Temp: 37.1 C  Resp: 18    Complications: No apparent anesthesia complications

## 2015-03-20 NOTE — Progress Notes (Signed)
Subjective: Postpartum Day 1: Cesarean Delivery Patient reports incisional pain, tolerating PO and + flatus.    Objective: Vital signs in last 24 hours: Temp:  [97.6 F (36.4 C)-98.8 F (37.1 C)] 98.7 F (37.1 C) (10/17 0409) Pulse Rate:  [69-89] 72 (10/17 0409) Resp:  [14-26] 18 (10/17 0409) BP: (99-121)/(48-63) 101/55 mmHg (10/17 0409) SpO2:  [94 %-99 %] 97 % (10/17 0409)  Physical Exam:  General: alert, cooperative, appears stated age and no distress Lochia: appropriate Uterine Fundus: firm Incision: healing well, no significant drainage, no dehiscence, no significant erythema DVT Evaluation: No evidence of DVT seen on physical exam. Negative Homan's sign. No cords or calf tenderness.   Recent Labs  03/18/15 0800 03/20/15 0522  HGB 9.2* 7.6*  HCT 27.1* 22.6*    Assessment/Plan: Status post Cesarean section. Doing well postoperatively.  Continue current care.  LAWSON, MARIE DARLENE 03/20/2015, 7:26 AM

## 2015-03-21 LAB — TYPE AND SCREEN
ABO/RH(D): A POS
Antibody Screen: NEGATIVE
UNIT DIVISION: 0
UNIT DIVISION: 0
UNIT DIVISION: 0
UNIT DIVISION: 0
UNIT DIVISION: 0
UNIT DIVISION: 0
UNIT DIVISION: 0
UNIT DIVISION: 0
Unit division: 0
Unit division: 0
Unit division: 0
Unit division: 0

## 2015-03-21 LAB — PREPARE FRESH FROZEN PLASMA
UNIT DIVISION: 0
UNIT DIVISION: 0

## 2015-03-21 MED ORDER — IBUPROFEN 600 MG PO TABS: 600.0000 mg | ORAL_TABLET | Freq: Four times a day (QID) | ORAL | Status: DC

## 2015-03-21 MED ORDER — ACETAMINOPHEN 325 MG PO TABS: 650.0000 mg | ORAL_TABLET | ORAL | Status: DC | PRN

## 2015-03-21 MED ORDER — SENNOSIDES-DOCUSATE SODIUM 8.6-50 MG PO TABS: 2.0000 | ORAL_TABLET | ORAL | Status: DC

## 2015-03-21 MED ORDER — OXYCODONE-ACETAMINOPHEN 5-325 MG PO TABS: 1.0000 | ORAL_TABLET | ORAL | Status: DC | PRN

## 2015-03-21 NOTE — Lactation Note (Signed)
This note was copied from the chart of [name]. Lactation Consultation Note Experienced BF mom but it has been a long time 11 yrs since she BF. Mom asked me to demonstrate her new Medela DEBP. I set up and discussed functions. Baby is a 35 weeks, looks slightly jaundice, TCB WDL. Reviewed LPI newborn behavior and stressed importance of I&O, waking for feeds, pumping after BF and supplementing w/BM, and if not enough then formula. Mom is having good transitional milk output.   Reviewed engorgement, breast massage, supply and demand, and jaundice. Mom has good everted nipples w/extra flanges d/t large nipples. Concerned about getting effective transfer during BF d/t small baby and mouth. Reminded of OP services and support groups. Mom feels confident about going home. Patient Name: Jane Tyson BabinskiMelinda Luby WUJWJ'XToday's Date: 03/21/2015 Reason for consult: Follow-up assessment   Maternal Data    Feeding Feeding Type: Bottle Fed - Formula Nipple Type: Slow - flow  LATCH Score/Interventions       Type of Nipple: Everted at rest and after stimulation  Comfort (Breast/Nipple): Soft / non-tender     Intervention(s): Skin to skin;Position options;Support Pillows;Breastfeeding basics reviewed     Lactation Tools Discussed/Used Pump Review: Setup, frequency, and cleaning;Milk Storage Initiated by:: Peri JeffersonL. Linley Moxley RN   Consult Status Consult Status: Complete Date: 03/21/15    Charyl DancerCARVER, Mirta Mally G 03/21/2015, 11:54 AM

## 2015-03-21 NOTE — Discharge Summary (Signed)
OB Discharge Summary     Patient Name: [name] DOB: Jan 29, 1976 MRN: [id number]  Date of admission: 02/27/2015 Delivering MD: Verita Schneiders A   Date of discharge: 03/21/2015  Admitting diagnosis: 41WKS, VASA PREVIA C/S, 87 WKS Intrauterine pregnancy: [redacted]w[redacted]d    Secondary diagnosis: Principal Problem:   Vasa previa Active Problems:   Advanced maternal age in multigravida   Gestational diabetes mellitus, antepartum   S/P cesarean section      Discharge diagnosis: Preterm Pregnancy Delivered                                                                                                Post partum procedures:none  Augmentation: n/a  Complications: None  Hospital course:  Sceduled C/S   39y.o. yo [id number] admitted to the hospital 02/27/2015 for scheduled cesarean section with the following indication:Primary C-section for Vasa Previa.  Membrane Rupture Time/Date: 9:50 AM ,03/19/2015   Patient delivered a Viable infant.03/19/2015  Details of operation can be found in separate operative note.  Pateint had an uncomplicated postpartum course.  She is ambulating, tolerating a regular diet, passing flatus, and urinating well. Patient is discharged home in stable condition on 03/21/2015     The patient had a prolonged hospitalization for vasa previa prior to this scheduled CS given the inherent risk of her vasa previa and fetal demise. She has very stable during this time. She received BMZ x2 prior to 28 weeks and then a repeat course prior to scheduled CS.   Physical exam  Filed Vitals:   03/19/15 2336 03/20/15 0409 03/20/15 0807 03/20/15 1724  BP: 103/57 101/55 114/61 111/58  Pulse: 75 72 67 72  Temp: 98.6 F (37 C) 98.7 F (37.1 C) 97.8 F (36.6 C) 97.6 F (36.4 C)  TempSrc: Oral Oral Oral Oral  Resp: 18 18 18 18   Height:      Weight:      SpO2: 96% 97%     General: alert, cooperative and no distress Lochia: appropriate Uterine Fundus:  firm Incision: Healing well with no significant drainage, No significant erythema, Dressing is clean, dry, and intact DVT Evaluation: No evidence of DVT seen on physical exam. Negative Homan's sign. Labs: Lab Results  Component Value Date   WBC 22.9* 03/20/2015   HGB 7.6* 03/20/2015   HCT 22.6* 03/20/2015   MCV 92.6 03/20/2015   PLT 313 03/20/2015   CMP 04/18/2008  Glucose 94  BUN 7  Creatinine 0.52  Sodium 142  Potassium 3.4(L)  Chloride 108  CO2 28  Calcium 9.0  Total Protein 5.6(L)  Total Bilirubin 0.5  Alkaline Phos 58  AST 12  ALT 10    Discharge instruction: per After Visit Summary and "Baby and Me Booklet".  Medications:    Medication List    STOP taking these medications        glyBURIDE 2.5 MG tablet  Commonly known as:  DIABETA      TAKE these medications        acetaminophen 325 MG tablet  Commonly known as:  TYLENOL  Take 2 tablets (650 mg total) by mouth every 4 (four) hours as needed for moderate pain (for pain scale < 4).     B-12 COMPLIANCE INJECTION IJ  Inject as directed once a week. 1 inj shot weekly     blood glucose meter kit and supplies Kit  Dispense based on patient and insurance preference. Use four times daily. (fasting and 2hr postprandial) (FOR ICD-10 O24.419).     ibuprofen 600 MG tablet  Commonly known as:  ADVIL,MOTRIN  Take 1 tablet (600 mg total) by mouth every 6 (six) hours.     J & J FIRST AID CREAM EX  Apply 1 application topically as needed.     oxyCODONE-acetaminophen 5-325 MG tablet  Commonly known as:  PERCOCET/ROXICET  Take 1 tablet by mouth every 4 (four) hours as needed for severe pain.     prenatal multivitamin Tabs tablet  Take 1 tablet by mouth daily at 12 noon.     pseudoephedrine 30 MG tablet  Commonly known as:  SUDAFED  Take 30 mg by mouth every 4 (four) hours as needed for congestion.     senna-docusate 8.6-50 MG tablet  Commonly known as:  Senokot-S  Take 2 tablets by mouth daily.        Diet: routine diet  Activity: Advance as tolerated. Pelvic rest for 6 weeks.   Outpatient follow up:6 weeks- routine pp care, 2 weeks for 2 hr gtt  Postpartum contraception: IUD Mirena  Newborn Data: Live born female  Birth Weight: 5 lb 1 oz (2296 g) APGAR: 7, 8  Baby Feeding: Bottle and Breast Disposition:home with mother   03/21/2015 Caren Macadam, MD

## 2015-03-21 NOTE — Discharge Instructions (Signed)

## 2015-05-24 ENCOUNTER — Ambulatory Visit (INDEPENDENT_AMBULATORY_CARE_PROVIDER_SITE_OTHER): Payer: Medicaid Other | Admitting: Advanced Practice Midwife

## 2015-05-24 ENCOUNTER — Encounter: Payer: Self-pay | Admitting: Advanced Practice Midwife

## 2015-05-24 VITALS — BP 128/85 | HR 110 | Temp 98.7°F | Wt 116.5 lb

## 2015-05-24 DIAGNOSIS — Z3202 Encounter for pregnancy test, result negative: Secondary | ICD-10-CM

## 2015-05-24 DIAGNOSIS — Z30430 Encounter for insertion of intrauterine contraceptive device: Secondary | ICD-10-CM

## 2015-05-24 DIAGNOSIS — R202 Paresthesia of skin: Secondary | ICD-10-CM | POA: Diagnosis not present

## 2015-05-24 DIAGNOSIS — N760 Acute vaginitis: Secondary | ICD-10-CM | POA: Diagnosis not present

## 2015-05-24 DIAGNOSIS — A499 Bacterial infection, unspecified: Secondary | ICD-10-CM

## 2015-05-24 DIAGNOSIS — B9689 Other specified bacterial agents as the cause of diseases classified elsewhere: Secondary | ICD-10-CM

## 2015-05-24 DIAGNOSIS — Z975 Presence of (intrauterine) contraceptive device: Secondary | ICD-10-CM

## 2015-05-24 LAB — POCT PREGNANCY, URINE: Preg Test, Ur: NEGATIVE

## 2015-05-24 MED ORDER — METRONIDAZOLE 500 MG PO TABS: 500.0000 mg | ORAL_TABLET | Freq: Two times a day (BID) | ORAL | Status: AC

## 2015-05-24 MED ORDER — LIDOCAINE 5 % EX PTCH: 1.0000 | MEDICATED_PATCH | CUTANEOUS | Status: DC

## 2015-05-24 MED ORDER — LEVONORGESTREL 18.6 MCG/DAY IU IUD
INTRAUTERINE_SYSTEM | Freq: Once | INTRAUTERINE | Status: AC
  Administered 2015-05-24: 16:00:00 via INTRAUTERINE

## 2015-05-24 NOTE — Progress Notes (Signed)
Patient ID: [name], female   DOB: 1976/03/03, 39 y.o.   MRN: [id number] Subjective:     [name] is a 39 y.o. female who presents for a postpartum visit. She is 9 weeks postpartum following a low cervical transverse Cesarean section. I have fully reviewed the prenatal and intrapartum course. The delivery was at 35 gestational weeks. Outcome: primary cesarean section, low transverse incision. Anesthesia: spinal. Postpartum course has been unremarkable. Baby's course has been unremarkable. Baby is feeding by both breast and bottle - Similac Advance. Bleeding staining only. Bowel function is normal. Bladder function is normal. Patient is not sexually active. Contraception method is IUD. Postpartum depression screening: negative.  The following portions of the patient's history were reviewed and updated as appropriate: allergies, current medications, past family history, past medical history, past social history, past surgical history and problem list.Wants Mirena or Liletta for contraception  Review of Systems Pertinent items are noted in HPI. Complains of fishy vaginal odor and burning "like a sunburn" from xiphoid to umbilicus   Objective:    BP 128/85 mmHg  Pulse 110  Temp(Src) 98.7 F (37.1 C) (Oral)  Wt 116 lb 8 oz (52.844 kg)  LMP 05/24/2015  Breastfeeding? Yes  General:  alert, cooperative and no distress   Breasts:  inspection negative, no nipple discharge or bleeding, no masses or nodularity palpable  Lungs: clear to auscultation bilaterally  Heart:  Normal rate  Abdomen: soft, non-tender; bowel sounds normal; no masses,  no organomegaly and No abnormalities found in area of paresthesia  Negative Murphy sign. No guarding or rebound. Burning sensation is totally  superficial   Vulva:  normal  Vagina: normal vagina and thin white discharge  Cervix:  multiparous appearance  Corpus: normal size, contour, position, consistency, mobility, non-tender  Adnexa:  normal adnexa  and no mass, fullness, tenderness  Rectal Exam: Not performed.        Patient identified, informed consent performed, signed copy in chart, time out was performed.  Urine pregnancy test negative.  Speculum placed in the vagina.  Cervix visualized.  Cleaned with Betadine x 2.  Uterus sounded to 6cm.  Lilletta IUD placed per manufacturer's recommendations.  Strings trimmed to 3 cm.   Patient given post procedure instructions and Mirena care card with expiration date.  Patient is asked to check IUD strings periodically and follow up in 4-6 weeks for IUD check.  Assessment:     Normal postpartum exam. Pap smear not done at today's visit.   Plan:    1. Contraception: IUD 2. Lilletta inserted without difficulty 3. Follow up in: 1 year or as needed.   Come back if she cannot feel strings (has had an IUD before) 4.  Rx Flagyl for BV 5.  Will refer to Ortho/Sports Med for evaluation of thoracic paresthesia.  Given the location of this sensation, doubtful it is related to C/S surgery per se or epidural (wrong dermatome). Suspect it is a nerve compression somewhere.

## 2015-05-24 NOTE — Patient Instructions (Signed)
Levonorgestrel intrauterine device (IUD) What is this medicine? LEVONORGESTREL IUD (LEE voe nor jes trel) is a contraceptive (birth control) device. The device is placed inside the uterus by a healthcare professional. It is used to prevent pregnancy and can also be used to treat heavy bleeding that occurs during your period. Depending on the device, it can be used for 3 to 5 years. This medicine may be used for other purposes; ask your health care provider or pharmacist if you have questions. What should I tell my health care provider before I take this medicine? They need to know if you have any of these conditions: -abnormal Pap smear -cancer of the breast, uterus, or cervix -diabetes -endometritis -genital or pelvic infection now or in the past -have more than one sexual partner or your partner has more than one partner -heart disease -history of an ectopic or tubal pregnancy -immune system problems -IUD in place -liver disease or tumor -problems with blood clots or take blood-thinners -use intravenous drugs -uterus of unusual shape -vaginal bleeding that has not been explained -an unusual or allergic reaction to levonorgestrel, other hormones, silicone, or polyethylene, medicines, foods, dyes, or preservatives -pregnant or trying to get pregnant -breast-feeding How should I use this medicine? This device is placed inside the uterus by a health care professional. Talk to your pediatrician regarding the use of this medicine in children. Special care may be needed. Overdosage: If you think you have taken too much of this medicine contact a poison control center or emergency room at once. NOTE: This medicine is only for you. Do not share this medicine with others. What if I miss a dose? This does not apply. What may interact with this medicine? Do not take this medicine with any of the following medications: -amprenavir -bosentan -fosamprenavir This medicine may also interact with  the following medications: -aprepitant -barbiturate medicines for inducing sleep or treating seizures -bexarotene -griseofulvin -medicines to treat seizures like carbamazepine, ethotoin, felbamate, oxcarbazepine, phenytoin, topiramate -modafinil -pioglitazone -rifabutin -rifampin -rifapentine -some medicines to treat HIV infection like atazanavir, indinavir, lopinavir, nelfinavir, tipranavir, ritonavir -St. John's wort -warfarin This list may not describe all possible interactions. Give your health care provider a list of all the medicines, herbs, non-prescription drugs, or dietary supplements you use. Also tell them if you smoke, drink alcohol, or use illegal drugs. Some items may interact with your medicine. What should I watch for while using this medicine? Visit your doctor or health care professional for regular check ups. See your doctor if you or your partner has sexual contact with others, becomes HIV positive, or gets a sexual transmitted disease. This product does not protect you against HIV infection (AIDS) or other sexually transmitted diseases. You can check the placement of the IUD yourself by reaching up to the top of your vagina with clean fingers to feel the threads. Do not pull on the threads. It is a good habit to check placement after each menstrual period. Call your doctor right away if you feel more of the IUD than just the threads or if you cannot feel the threads at all. The IUD may come out by itself. You may become pregnant if the device comes out. If you notice that the IUD has come out use a backup birth control method like condoms and call your health care provider. Using tampons will not change the position of the IUD and are okay to use during your period. What side effects may I notice from receiving this medicine?   Side effects that you should report to your doctor or health care professional as soon as possible: -allergic reactions like skin rash, itching or  hives, swelling of the face, lips, or tongue -fever, flu-like symptoms -genital sores -high blood pressure -no menstrual period for 6 weeks during use -pain, swelling, warmth in the leg -pelvic pain or tenderness -severe or sudden headache -signs of pregnancy -stomach cramping -sudden shortness of breath -trouble with balance, talking, or walking -unusual vaginal bleeding, discharge -yellowing of the eyes or skin Side effects that usually do not require medical attention (report to your doctor or health care professional if they continue or are bothersome): -acne -breast pain -change in sex drive or performance -changes in weight -cramping, dizziness, or faintness while the device is being inserted -headache -irregular menstrual bleeding within first 3 to 6 months of use -nausea This list may not describe all possible side effects. Call your doctor for medical advice about side effects. You may report side effects to FDA at 1-800-FDA-1088. Where should I keep my medicine? This does not apply. NOTE: This sheet is a summary. It may not cover all possible information. If you have questions about this medicine, talk to your doctor, pharmacist, or health care provider.    2016, Elsevier/Gold Standard. (2011-06-20 13:54:04)  

## 2015-05-24 NOTE — Addendum Note (Signed)
Addended by: Garret ReddishBARNES, Isacc Turney M on: 05/24/2015 04:48 PM   Modules accepted: Orders

## 2015-06-01 ENCOUNTER — Ambulatory Visit (INDEPENDENT_AMBULATORY_CARE_PROVIDER_SITE_OTHER): Payer: Medicaid Other | Admitting: Family Medicine

## 2015-06-01 ENCOUNTER — Encounter: Payer: Self-pay | Admitting: Family Medicine

## 2015-06-01 VITALS — BP 120/72 | HR 99 | Ht 64.0 in | Wt 117.0 lb

## 2015-06-01 DIAGNOSIS — R202 Paresthesia of skin: Secondary | ICD-10-CM

## 2015-06-01 DIAGNOSIS — E538 Deficiency of other specified B group vitamins: Secondary | ICD-10-CM

## 2015-06-01 MED ORDER — CYANOCOBALAMIN 1000 MCG/ML IJ SOLN
1000.0000 ug | Freq: Once | INTRAMUSCULAR | Status: AC
  Administered 2015-06-01: 1000 ug via INTRAMUSCULAR

## 2015-06-01 NOTE — Patient Instructions (Addendum)
Good to see you Congrats on the baby! Start the B12 shots again.  Iron 65mg  3 times a week with 500mg  of vitamin C  Try the pennsaid but please wash your hands well an dlet it dry.  Try a fingertip amount 2 times daily as needed See me again in 3 weeks and if not better we will consider a little more work up including xrays and maybe labs.  Happy New Year!

## 2015-06-01 NOTE — Progress Notes (Signed)
[name] D.O. [facility] [address] [place], [place] [address] Phone: [phone] Subjective:    I'm seeing this patient by the request  of:  Wynelle BourgeoisWilliams, Marie,   CC: back pain and numbness  BJY:[id number]:Subjective [name] is a 39 y.o. female coming in with complaint of back pain and numbness. Patient's 3 months ago delivered at [redacted] weeks gestation and had a low cesarean section transverse incision. Patient did have a spinal anesthesia. Did not have any significant problems post partum. Patient did have placement of an IUD after delivery.patient was sent here for further evaluation for the thoracic paresthesia.patient states that it is not with movement but only with touch. States that certain level of palpation seems to be worse. Patient states deep palpation does not seem to hurt. Denies any significant radiation to any of the extremities. States sometimes it can get red and warm and sometimes it can be very cold compared to the rest of her body. Denies though any significant swelling to the area. Patient was wrestling put on antibiotic and has not notice any significant change. Has not found any home remedies at this point. States though that it does not wake her up. Can pick up her 313 month old infant with no significant difficulty but staying down in a flexed position or even holding her with some mild palpation in the area can be almost excruciating. States this feeling goes from the tip of the sternum all the way to the incision site. Seems to stay midline.     Past Medical History  Diagnosis Date  . Complication of anesthesia   . Iron deficiency   . Diabetes mellitus without complication (HCC)   . Anemia     Prenicious Anemia   Past Surgical History  Procedure Laterality Date  . Liver biopsy    . Cesarean section N/A 03/19/2015    Procedure: CESAREAN SECTION;  Surgeon: Tereso NewcomerUgonna A Anyanwu, MD;  Location: [facility];  Service: Obstetrics;  Laterality: N/A;  vasa previa    Social History  Substance Use Topics  . Smoking status: Current Every Day Smoker -- 0.25 packs/day  . Smokeless tobacco: None  . Alcohol Use: No   Allergies  Allergen Reactions  . Eggs Or Egg-Derived Products Nausea Only   Family History  Problem Relation Age of Onset  . Hypertension Mother   . Mental illness Mother   . Hyperthyroidism Daughter   . Diabetes Maternal Grandmother      Past medical history, social, surgical and family history all reviewed in electronic medical record.   Review of Systems: No headache, visual changes, nausea, vomiting, diarrhea, constipation, dizziness, abdominal pain, skin rash, fevers, chills, night sweats, weight loss, swollen lymph nodes, body aches, joint swelling, muscle aches, chest pain, shortness of breath, mood changes.   Objective Last menstrual period 05/24/2015, currently breastfeeding.  General: No apparent distress alert and oriented x3 mood and affect normal, dressed appropriately.  HEENT: Pupils equal, extraocular movements intact  Respiratory: Patient's speak in full sentences and does not appear short of breath  Cardiovascular: No lower extremity edema, non tender, no erythema  Skin: Warm dry intact with no signs of infection or rash on extremities or on axial skeleton.  Abdomen: Soft patient is tender to palpation when palpating the skin in the dermis but with deep palpation no significant pain. Patient isn't has discomfort with light sensation. No erythema or rashes noted. Not warm to touch. Neuro: Cranial nerves II through XII are intact,  neurovascularly intact in all extremities with 2+ DTRs and 2+ pulses.  Lymph: No lymphadenopathy of posterior or anterior cervical chain or axillae bilaterally.  Gait normal with good balance and coordination.  MSK:  Non tender with full range of motion and good stability and symmetric strength and tone of shoulders, elbows, wrist, hip, knee and ankles bilaterally.  Back Exam:  Inspection:  Unremarkable  Motion: Flexion 45 deg, Extension 35 deg, Side Bending to 45 deg bilaterally,  Rotation to 45 deg bilaterally  SLR laying: Negative  XSLR laying: Negative  Palpable tenderness: None. FABER: negative. Sensory change: Gross sensation intact to all lumbar and sacral dermatomes.  Reflexes: 2+ at both patellar tendons, 2+ at achilles tendons, Babinski's downgoing.  Strength at foot  Plantar-flexion: 5/5 Dorsi-flexion: 5/5 Eversion: 5/5 Inversion: 5/5  Leg strength  Quad: 5/5 Hamstring: 5/5 Hip flexor: 5/5 Hip abductors: 4/5  Gait unremarkable.    Impression and Recommendations:     This case required medical decision making of moderate complexity.

## 2015-06-01 NOTE — Progress Notes (Signed)
Pre visit review using our clinic review tool, if applicable. No additional management support is needed unless otherwise documented below in the visit note. 

## 2015-06-01 NOTE — Assessment & Plan Note (Signed)
Thoracic and nature. There is no single dermatomes that would give this presentation and only be in the umbilical region from the xiphoid to the  Incision. Only hurts to light palpation. Seems to be more secondary to neurovascular been musculature with no pain with range of motion testing today. Patient does have a past Jane NeedleMichael history significant for B12 deficiency and patient was given a B12 injection today She is 3 months past her previous one. We discussed differential including patient's anemia and iron deficiency that can also give some type of relation to this. Patient will do some mild supplementation. We discussed vitamin D supplementation the can also help with any type or healing properties. Differential also includes a complex regional pain syndrome but this is very little less likely but patient does give some history of erythema as well as possible coldness compared to the surrounding area. Patient's does not have any signs of infectious etiology at this time. Patient is on antibiotic and I do think she shouldn't finish this over the course of the next 7 days. Patient given a trial of topical anti-inflammatory but discussed safety for her infant. Patient will try these different interventions and come back and see me again in 3 weeks. If any worsening symptoms we will need to consider further workup including imaging and likely laboratory workup.

## 2015-06-22 ENCOUNTER — Ambulatory Visit: Payer: Medicaid Other | Admitting: Family Medicine

## 2016-01-24 ENCOUNTER — Inpatient Hospital Stay (HOSPITAL_COMMUNITY)
Admission: EM | Admit: 2016-01-24 | Discharge: 2016-01-27 | DRG: 897 | Disposition: A | Discharge status: Home / Self Care | Payer: Medicaid Other | Attending: Family Medicine | Admitting: Family Medicine

## 2016-01-24 ENCOUNTER — Encounter (HOSPITAL_COMMUNITY): Payer: Self-pay | Admitting: Adult Health

## 2016-01-24 DIAGNOSIS — R109 Unspecified abdominal pain: Secondary | ICD-10-CM | POA: Diagnosis present

## 2016-01-24 DIAGNOSIS — E46 Unspecified protein-calorie malnutrition: Secondary | ICD-10-CM | POA: Diagnosis present

## 2016-01-24 DIAGNOSIS — Z818 Family history of other mental and behavioral disorders: Secondary | ICD-10-CM

## 2016-01-24 DIAGNOSIS — F1910 Other psychoactive substance abuse, uncomplicated: Secondary | ICD-10-CM | POA: Diagnosis present

## 2016-01-24 DIAGNOSIS — Z720 Tobacco use: Secondary | ICD-10-CM | POA: Diagnosis not present

## 2016-01-24 DIAGNOSIS — F603 Borderline personality disorder: Secondary | ICD-10-CM | POA: Diagnosis present

## 2016-01-24 DIAGNOSIS — F10939 Alcohol use, unspecified with withdrawal, unspecified: Secondary | ICD-10-CM | POA: Diagnosis present

## 2016-01-24 DIAGNOSIS — F1410 Cocaine abuse, uncomplicated: Secondary | ICD-10-CM | POA: Diagnosis present

## 2016-01-24 DIAGNOSIS — F10239 Alcohol dependence with withdrawal, unspecified: Secondary | ICD-10-CM | POA: Diagnosis not present

## 2016-01-24 DIAGNOSIS — E872 Acidosis, unspecified: Secondary | ICD-10-CM | POA: Diagnosis present

## 2016-01-24 DIAGNOSIS — Z681 Body mass index (BMI) 19 or less, adult: Secondary | ICD-10-CM

## 2016-01-24 DIAGNOSIS — E119 Type 2 diabetes mellitus without complications: Secondary | ICD-10-CM | POA: Diagnosis present

## 2016-01-24 DIAGNOSIS — F17210 Nicotine dependence, cigarettes, uncomplicated: Secondary | ICD-10-CM | POA: Diagnosis present

## 2016-01-24 DIAGNOSIS — K2920 Alcoholic gastritis without bleeding: Secondary | ICD-10-CM | POA: Diagnosis present

## 2016-01-24 DIAGNOSIS — F1990 Other psychoactive substance use, unspecified, uncomplicated: Secondary | ICD-10-CM | POA: Diagnosis present

## 2016-01-24 DIAGNOSIS — E876 Hypokalemia: Secondary | ICD-10-CM | POA: Diagnosis present

## 2016-01-24 DIAGNOSIS — D7589 Other specified diseases of blood and blood-forming organs: Secondary | ICD-10-CM | POA: Diagnosis present

## 2016-01-24 DIAGNOSIS — F4310 Post-traumatic stress disorder, unspecified: Secondary | ICD-10-CM | POA: Diagnosis present

## 2016-01-24 DIAGNOSIS — T730XXA Starvation, initial encounter: Secondary | ICD-10-CM | POA: Diagnosis present

## 2016-01-24 DIAGNOSIS — F1020 Alcohol dependence, uncomplicated: Secondary | ICD-10-CM | POA: Diagnosis present

## 2016-01-24 DIAGNOSIS — R7989 Other specified abnormal findings of blood chemistry: Secondary | ICD-10-CM | POA: Diagnosis present

## 2016-01-24 DIAGNOSIS — F1210 Cannabis abuse, uncomplicated: Secondary | ICD-10-CM | POA: Diagnosis present

## 2016-01-24 DIAGNOSIS — Z91012 Allergy to eggs: Secondary | ICD-10-CM

## 2016-01-24 DIAGNOSIS — R945 Abnormal results of liver function studies: Secondary | ICD-10-CM | POA: Diagnosis present

## 2016-01-24 DIAGNOSIS — R636 Underweight: Secondary | ICD-10-CM

## 2016-01-24 HISTORY — DX: Alcohol dependence, uncomplicated: F10.20

## 2016-01-24 HISTORY — DX: Tobacco use: Z72.0

## 2016-01-24 HISTORY — DX: Other psychoactive substance abuse, uncomplicated: F19.10

## 2016-01-24 LAB — CBC
HCT: 46.3 % — ABNORMAL HIGH (ref 36.0–46.0)
Hemoglobin: 15.1 g/dL — ABNORMAL HIGH (ref 12.0–15.0)
MCH: 34.1 pg — ABNORMAL HIGH (ref 26.0–34.0)
MCHC: 32.6 g/dL (ref 30.0–36.0)
MCV: 104.5 fL — AB (ref 78.0–100.0)
PLATELETS: 300 10*3/uL (ref 150–400)
RBC: 4.43 MIL/uL (ref 3.87–5.11)
RDW: 15.3 % (ref 11.5–15.5)
WBC: 9.1 10*3/uL (ref 4.0–10.5)

## 2016-01-24 LAB — URINE MICROSCOPIC-ADD ON

## 2016-01-24 LAB — RAPID URINE DRUG SCREEN, HOSP PERFORMED
AMPHETAMINES: NOT DETECTED
BENZODIAZEPINES: POSITIVE — AB
Barbiturates: NOT DETECTED
COCAINE: POSITIVE — AB
OPIATES: POSITIVE — AB
Tetrahydrocannabinol: NOT DETECTED

## 2016-01-24 LAB — COMPREHENSIVE METABOLIC PANEL
ALK PHOS: 131 U/L — AB (ref 38–126)
ALT: 95 U/L — ABNORMAL HIGH (ref 14–54)
ANION GAP: 18 — AB (ref 5–15)
AST: 109 U/L — ABNORMAL HIGH (ref 15–41)
Albumin: 4 g/dL (ref 3.5–5.0)
BUN: 5 mg/dL — ABNORMAL LOW (ref 6–20)
CALCIUM: 9.5 mg/dL (ref 8.9–10.3)
CO2: 20 mmol/L — AB (ref 22–32)
CREATININE: 0.8 mg/dL (ref 0.44–1.00)
Chloride: 99 mmol/L — ABNORMAL LOW (ref 101–111)
Glucose, Bld: 73 mg/dL (ref 65–99)
Potassium: 4.3 mmol/L (ref 3.5–5.1)
SODIUM: 137 mmol/L (ref 135–145)
TOTAL PROTEIN: 7 g/dL (ref 6.5–8.1)
Total Bilirubin: 2 mg/dL — ABNORMAL HIGH (ref 0.3–1.2)

## 2016-01-24 LAB — URINALYSIS, ROUTINE W REFLEX MICROSCOPIC
Glucose, UA: NEGATIVE mg/dL
Hgb urine dipstick: NEGATIVE
Ketones, ur: >80 mg/dL — AB
Nitrite: NEGATIVE
PROTEIN: NEGATIVE mg/dL
Specific Gravity, Urine: 1.025 (ref 1.005–1.030)
pH: 6 (ref 5.0–8.0)

## 2016-01-24 LAB — POC URINE PREG, ED: PREG TEST UR: NEGATIVE

## 2016-01-24 LAB — ETHANOL

## 2016-01-24 LAB — LIPASE, BLOOD: Lipase: 15 U/L (ref 11–51)

## 2016-01-24 LAB — LACTIC ACID, PLASMA: LACTIC ACID, VENOUS: 0.8 mmol/L (ref 0.5–1.9)

## 2016-01-24 MED ORDER — LORAZEPAM 2 MG/ML IJ SOLN: 0.0000 mg | Freq: Two times a day (BID) | INTRAMUSCULAR | Status: DC

## 2016-01-24 MED ORDER — PANTOPRAZOLE SODIUM 40 MG PO TBEC
40.0000 mg | DELAYED_RELEASE_TABLET | Freq: Every day | ORAL | Status: DC
  Administered 2016-01-25 – 2016-01-26 (×2): 40 mg via ORAL
  Filled 2016-01-24 (×2): qty 1

## 2016-01-24 MED ORDER — SODIUM CHLORIDE 0.9% FLUSH
3.0000 mL | Freq: Two times a day (BID) | INTRAVENOUS | Status: DC
  Administered 2016-01-24 – 2016-01-27 (×4): 3 mL via INTRAVENOUS

## 2016-01-24 MED ORDER — LORAZEPAM 2 MG/ML IJ SOLN
1.0000 mg | Freq: Four times a day (QID) | INTRAMUSCULAR | Status: DC | PRN
  Administered 2016-01-25: 1 mg via INTRAVENOUS
  Filled 2016-01-24: qty 1

## 2016-01-24 MED ORDER — VITAMIN B-1 100 MG PO TABS
100.0000 mg | ORAL_TABLET | Freq: Every day | ORAL | Status: DC
  Administered 2016-01-24 – 2016-01-27 (×4): 100 mg via ORAL
  Filled 2016-01-24 (×4): qty 1

## 2016-01-24 MED ORDER — SODIUM CHLORIDE 0.9 % IV BOLUS (SEPSIS)
1000.0000 mL | Freq: Once | INTRAVENOUS | Status: AC
  Administered 2016-01-24: 1000 mL via INTRAVENOUS

## 2016-01-24 MED ORDER — FOLIC ACID 1 MG PO TABS
1.0000 mg | ORAL_TABLET | Freq: Every day | ORAL | Status: DC
  Administered 2016-01-24 – 2016-01-27 (×4): 1 mg via ORAL
  Filled 2016-01-24 (×4): qty 1

## 2016-01-24 MED ORDER — NICOTINE 21 MG/24HR TD PT24
21.0000 mg | MEDICATED_PATCH | Freq: Every day | TRANSDERMAL | Status: DC
  Administered 2016-01-24 – 2016-01-27 (×4): 21 mg via TRANSDERMAL
  Filled 2016-01-24 (×4): qty 1

## 2016-01-24 MED ORDER — ADULT MULTIVITAMIN W/MINERALS CH
1.0000 | ORAL_TABLET | Freq: Every day | ORAL | Status: DC
  Administered 2016-01-24 – 2016-01-27 (×4): 1 via ORAL
  Filled 2016-01-24 (×4): qty 1

## 2016-01-24 MED ORDER — LORAZEPAM 2 MG/ML IJ SOLN
1.0000 mg | Freq: Once | INTRAMUSCULAR | Status: AC
  Administered 2016-01-24: 1 mg via INTRAVENOUS
  Filled 2016-01-24: qty 1

## 2016-01-24 MED ORDER — THIAMINE HCL 100 MG/ML IJ SOLN
100.0000 mg | Freq: Every day | INTRAMUSCULAR | Status: DC
  Filled 2016-01-24: qty 2

## 2016-01-24 MED ORDER — ONDANSETRON 4 MG PO TBDP
4.0000 mg | ORAL_TABLET | Freq: Once | ORAL | Status: AC
  Administered 2016-01-24: 4 mg via ORAL

## 2016-01-24 MED ORDER — SODIUM CHLORIDE 0.9 % IV SOLN: INTRAVENOUS | Status: DC

## 2016-01-24 MED ORDER — ONDANSETRON HCL 4 MG/2ML IJ SOLN: 4.0000 mg | Freq: Three times a day (TID) | INTRAMUSCULAR | Status: DC | PRN

## 2016-01-24 MED ORDER — OXYCODONE HCL 5 MG PO TABS: 5.0000 mg | ORAL_TABLET | ORAL | Status: DC | PRN

## 2016-01-24 MED ORDER — CHLORDIAZEPOXIDE HCL 5 MG PO CAPS
5.0000 mg | ORAL_CAPSULE | Freq: Three times a day (TID) | ORAL | Status: DC
  Administered 2016-01-24 – 2016-01-26 (×5): 5 mg via ORAL
  Filled 2016-01-24 (×5): qty 1

## 2016-01-24 MED ORDER — LORAZEPAM 1 MG PO TABS
1.0000 mg | ORAL_TABLET | Freq: Four times a day (QID) | ORAL | Status: DC | PRN
  Administered 2016-01-25: 1 mg via ORAL
  Filled 2016-01-24 (×2): qty 1

## 2016-01-24 MED ORDER — DEXTROSE-NACL 5-0.45 % IV SOLN
INTRAVENOUS | Status: DC
  Administered 2016-01-24 – 2016-01-27 (×7): via INTRAVENOUS

## 2016-01-24 MED ORDER — LORAZEPAM 2 MG/ML IJ SOLN
0.0000 mg | Freq: Four times a day (QID) | INTRAMUSCULAR | Status: AC
  Administered 2016-01-24 – 2016-01-25 (×3): 1 mg via INTRAVENOUS
  Administered 2016-01-26: 2 mg via INTRAVENOUS
  Administered 2016-01-26: 1 mg via INTRAVENOUS
  Filled 2016-01-24 (×5): qty 1

## 2016-01-24 MED ORDER — ONDANSETRON 4 MG PO TBDP
ORAL_TABLET | ORAL | Status: AC
  Filled 2016-01-24: qty 1

## 2016-01-24 MED ORDER — ENOXAPARIN SODIUM 40 MG/0.4ML ~~LOC~~ SOLN
40.0000 mg | SUBCUTANEOUS | Status: DC
  Administered 2016-01-24 – 2016-01-26 (×3): 40 mg via SUBCUTANEOUS
  Filled 2016-01-24 (×3): qty 0.4
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
## 2016-01-24 NOTE — Progress Notes (Signed)
Received report from ED RN, Liz BeachGabe.

## 2016-01-24 NOTE — Progress Notes (Signed)
Attempted to receive report from ED RN.  
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
## 2016-01-24 NOTE — H&P (Signed)
History and Physical    [name] XBJ:[id number] DOB: [birth year] DOA: 01/24/2016  Referring MD/NP/PA:   PCP: No PCP Per Patient   Patient coming from:  The patient is coming from home.  At baseline, pt is independent for most of ADL.   Chief Complaint: Alcohol problem and abdominal pain,   HPI: [name] is a 40 y.o. female with medical history significant of Polysubstance abuse including marijuana, cocaine, alcohol, tobacco abuse, IV drug user, who presents with alcohol problem and abdominal pain.  Pt has been drinking alcohol for more than 15 years. Pt states that she drinks a 5th of vodka daily. Last drink was 11am yesterday. She had visual hallucinations last night. She also has headache, body aches, sweating, anxiety and abdominal pain. She was seen by her therapist yesterday and advised to go straight to ED for help with detox symptoms. Has never had withdrawal seizures before.   Patient states she has intermittent abdominal pain. It is located in the right upper quadrant and lower abdomen, moderate, nonradiating. She also has nausea and vomiting occasionally. She had 1 loose stool bowel movement today. Patient also reports dysuria, but no burning or urinary frequency. She has had mild cough due to smoking, but no chest pain or shortness of breath. No fever or chills.  ED Course: pt was found to have WBC 9.1, lactate is 0.8, lipase 15, negative pregnancy test, alcohol level less than 5, positive UDS for cocaine, benzo and opiate, metabolic acidosis with bicarbonate 20 and anion gap 18, renal function normal, abnormal liver function with ALP was 31, AST 109, ALT 95, total bilirubin 2.0. Patient is placed on telemetry bed for observation.   Review of Systems:   General: no fevers, chills, no changes in body weight, has poor appetite, has fatigue HEENT: no blurry vision, hearing changes or sore throat Respiratory: no dyspnea, has coughing, no wheezing CV: no chest pain, no  palpitations GI: has nausea, vomiting, abdominal pain, diarrhea, no constipation GU: has dysuria, no burning on urination, increased urinary frequency, hematuria  Ext: no leg edema Neuro: no unilateral weakness, numbness, or tingling, no vision change or hearing loss Skin: no rash MSK: No muscle spasm, no deformity, no limitation of range of movement in spin Heme: No easy bruising.  Travel history: No recent long distant travel.  Allergy:  Allergies  Allergen Reactions  . Eggs Or Egg-Derived Products Nausea Only    Past Medical History:  Diagnosis Date  . Alcoholism (HCC)   . Anemia    Prenicious Anemia  . Complication of anesthesia   . Diabetes mellitus without complication (HCC)   . Iron deficiency   . Polysubstance abuse   . Tobacco abuse     Past Surgical History:  Procedure Laterality Date  . CESAREAN SECTION N/A 03/19/2015   Procedure: CESAREAN SECTION;  Surgeon: Tereso Newcomer, MD;  Location: [facility];  Service: Obstetrics;  Laterality: N/A;  vasa previa  . LIVER BIOPSY      Social History:  reports that she has been smoking.  She has been smoking about 0.25 packs per day. She has never used smokeless tobacco. She reports that she drinks alcohol. She reports that she uses drugs, including Marijuana, Cocaine, and IV.  Family History:  Family History  Problem Relation Age of Onset  . Hypertension Mother   . Mental illness Mother   . Hyperthyroidism Daughter   . Diabetes Maternal Grandmother      Prior to Admission medications  Medication Sig Start Date End Date Taking? Authorizing Provider  Prenatal Vit-Fe Fumarate-FA (PRENATAL MULTIVITAMIN) TABS tablet Take 1 tablet by mouth daily at 12 noon. Reported on 05/24/2015    Historical Provider, MD    Physical Exam: Vitals:   01/24/16 1603 01/24/16 1815 01/24/16 1830 01/24/16 2155  BP: 129/86  128/74 111/71  Pulse: 82 77 (!) 46 82  Resp: 16   18  Temp:    98.3 F (36.8 C)  TempSrc:      SpO2: 99%  100% 99%    Weight:    46.9 kg (103 lb 4.8 oz)  Height:    5\' 5"  (1.651 m)   General: Not in acute distress. Dry mucus membrane HEENT:       Eyes: PERRL, EOMI, no scleral icterus.       ENT: No discharge from the ears and nose, no pharynx injection, no tonsillar enlargement.        Neck: No JVD, no bruit, no mass felt. Heme: No neck lymph node enlargement. Cardiac: S1/S2, RRR, No murmurs, No gallops or rubs. Respiratory: No rales, wheezing, rhonchi or rubs. GI: Soft, nondistended, tenderness over RUQ and lower abdomen, no rebound pain, no organomegaly, BS present. GU: No hematuria Ext: No pitting leg edema bilaterally. 2+DP/PT pulse bilaterally. Musculoskeletal: No joint deformities, No joint redness or warmth, no limitation of ROM in spin. Skin: No rashes.  Neuro: Alert, oriented X3, cranial nerves II-XII grossly intact, moves all extremities normally.  Psych: Patient is not psychotic, no suicidal or hemocidal ideation.  Labs on Admission: I have personally reviewed following labs and imaging studies  CBC:  Recent Labs Lab 01/24/16 1454  WBC 9.1  HGB 15.1*  HCT 46.3*  MCV 104.5*  PLT 300   Basic Metabolic Panel:  Recent Labs Lab 01/24/16 1454  NA 137  K 4.3  CL 99*  CO2 20*  GLUCOSE 73  BUN 5*  CREATININE 0.80  CALCIUM 9.5   GFR: Estimated Creatinine Clearance: 69.2 mL/min (by C-G formula based on SCr of 0.8 mg/dL). Liver Function Tests:  Recent Labs Lab 01/24/16 1454  AST 109*  ALT 95*  ALKPHOS 131*  BILITOT 2.0*  PROT 7.0  ALBUMIN 4.0    Recent Labs Lab 01/24/16 2045  LIPASE 15   No results for input(s): AMMONIA in the last 168 hours. Coagulation Profile: No results for input(s): INR, PROTIME in the last 168 hours. Cardiac Enzymes: No results for input(s): CKTOTAL, CKMB, CKMBINDEX, TROPONINI in the last 168 hours. BNP (last 3 results) No results for input(s): PROBNP in the last 8760 hours. HbA1C: No results for input(s): HGBA1C in the last 72  hours. CBG: No results for input(s): GLUCAP in the last 168 hours. Lipid Profile: No results for input(s): CHOL, HDL, LDLCALC, TRIG, CHOLHDL, LDLDIRECT in the last 72 hours. Thyroid Function Tests: No results for input(s): TSH, T4TOTAL, FREET4, T3FREE, THYROIDAB in the last 72 hours. Anemia Panel: No results for input(s): VITAMINB12, FOLATE, FERRITIN, TIBC, IRON, RETICCTPCT in the last 72 hours. Urine analysis:    Component Value Date/Time   COLORURINE AMBER (A) 01/24/2016 1948   APPEARANCEUR CLOUDY (A) 01/24/2016 1948   LABSPEC 1.025 01/24/2016 1948   PHURINE 6.0 01/24/2016 1948   GLUCOSEU NEGATIVE 01/24/2016 1948   HGBUR NEGATIVE 01/24/2016 1948   BILIRUBINUR LARGE (A) 01/24/2016 1948   KETONESUR >80 (A) 01/24/2016 1948   PROTEINUR NEGATIVE 01/24/2016 1948   UROBILINOGEN 0.2 03/02/2015 1245   NITRITE NEGATIVE 01/24/2016 1948   LEUKOCYTESUR TRACE (  A) 01/24/2016 1948   Sepsis Labs: @LABRCNTIP (procalcitonin:4,lacticidven:4) )No results found for this or any previous visit (from the past 240 hour(s)).   Radiological Exams on Admission: No results found.   EKG:  Not done in ED, will get one.   Assessment/Plan Principal Problem:   Alcohol withdrawal (HCC) Active Problems:   Alcoholism (HCC)   Tobacco abuse   Polysubstance abuse   Metabolic acidosis   Abnormal LFTs   Abdominal pain   Alcohol withdrawal (HCC): She has visual hallucinations, headache, body aches, sweating and anxiety, consistent with alcohol withdraw. No history of seizure from alcohol withdraw.  -wil place on tele bed for obs -CiWA protcol -Schedule hlordiazePOXIDE (LIBRIUM) capsule 5 mg tid -IVF: 1L NS, then D5-1/2 at 100 cc/h  Abdominal pain: lipase normal. Physical examination of abdomen is benign, no acute abdomen. 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 -Start Protonix -When necessary oxycodone -When necessary Zofran for nausea   Abnormal LFTs: Likely due to alcohol abuse. -will check hepatitis  panel and HIV antibody -avoid Tylenol  Metabolic acidosis: Likely due to starvation 2/2 alcohol abuse. Lactate and normal -IV D5-1/2 NS as above  Polysubstance abuse: including marijuana, cocaine, alcohol, tobacco abuse, IV drug user -did counseling about importance of quitting substance -Nicotine patch  Protein calorie mal-nutrition: -Consult to nutrition   DVT ppx: SQ lovenox Code Status: Full code Family Communication: None at bed side.  Disposition Plan:  Anticipate discharge back to previous home environment Consults called:  none Admission status: Obs / tele    Date of Service 01/25/2016    Lorretta HarpIU, Pennye Beeghly [facility] Pager [phone]  If 7PM-7AM, please contact night-coverage [email or web address] Password [facility] 01/25/2016, 3:36 AM

## 2016-01-25 DIAGNOSIS — T730XXA Starvation, initial encounter: Secondary | ICD-10-CM | POA: Diagnosis present

## 2016-01-25 DIAGNOSIS — R636 Underweight: Secondary | ICD-10-CM | POA: Diagnosis not present

## 2016-01-25 DIAGNOSIS — F1020 Alcohol dependence, uncomplicated: Secondary | ICD-10-CM | POA: Diagnosis not present

## 2016-01-25 DIAGNOSIS — F17210 Nicotine dependence, cigarettes, uncomplicated: Secondary | ICD-10-CM | POA: Diagnosis present

## 2016-01-25 DIAGNOSIS — E46 Unspecified protein-calorie malnutrition: Secondary | ICD-10-CM | POA: Diagnosis present

## 2016-01-25 DIAGNOSIS — F10230 Alcohol dependence with withdrawal, uncomplicated: Secondary | ICD-10-CM | POA: Diagnosis not present

## 2016-01-25 DIAGNOSIS — F1410 Cocaine abuse, uncomplicated: Secondary | ICD-10-CM | POA: Diagnosis present

## 2016-01-25 DIAGNOSIS — F1990 Other psychoactive substance use, unspecified, uncomplicated: Secondary | ICD-10-CM | POA: Diagnosis present

## 2016-01-25 DIAGNOSIS — R945 Abnormal results of liver function studies: Secondary | ICD-10-CM | POA: Diagnosis present

## 2016-01-25 DIAGNOSIS — F1210 Cannabis abuse, uncomplicated: Secondary | ICD-10-CM | POA: Diagnosis present

## 2016-01-25 DIAGNOSIS — Z818 Family history of other mental and behavioral disorders: Secondary | ICD-10-CM | POA: Diagnosis not present

## 2016-01-25 DIAGNOSIS — F10232 Alcohol dependence with withdrawal with perceptual disturbance: Secondary | ICD-10-CM | POA: Diagnosis not present

## 2016-01-25 DIAGNOSIS — F4310 Post-traumatic stress disorder, unspecified: Secondary | ICD-10-CM | POA: Diagnosis present

## 2016-01-25 DIAGNOSIS — Z91012 Allergy to eggs: Secondary | ICD-10-CM | POA: Diagnosis not present

## 2016-01-25 DIAGNOSIS — R7989 Other specified abnormal findings of blood chemistry: Secondary | ICD-10-CM | POA: Diagnosis not present

## 2016-01-25 DIAGNOSIS — D7589 Other specified diseases of blood and blood-forming organs: Secondary | ICD-10-CM | POA: Diagnosis present

## 2016-01-25 DIAGNOSIS — E119 Type 2 diabetes mellitus without complications: Secondary | ICD-10-CM | POA: Diagnosis present

## 2016-01-25 DIAGNOSIS — F603 Borderline personality disorder: Secondary | ICD-10-CM | POA: Diagnosis present

## 2016-01-25 DIAGNOSIS — F10239 Alcohol dependence with withdrawal, unspecified: Secondary | ICD-10-CM | POA: Diagnosis present

## 2016-01-25 DIAGNOSIS — F1910 Other psychoactive substance abuse, uncomplicated: Secondary | ICD-10-CM | POA: Diagnosis not present

## 2016-01-25 DIAGNOSIS — Z681 Body mass index (BMI) 19 or less, adult: Secondary | ICD-10-CM | POA: Diagnosis not present

## 2016-01-25 DIAGNOSIS — R109 Unspecified abdominal pain: Secondary | ICD-10-CM | POA: Diagnosis present

## 2016-01-25 DIAGNOSIS — K2920 Alcoholic gastritis without bleeding: Secondary | ICD-10-CM | POA: Diagnosis present

## 2016-01-25 DIAGNOSIS — E876 Hypokalemia: Secondary | ICD-10-CM | POA: Diagnosis present

## 2016-01-25 DIAGNOSIS — E872 Acidosis: Secondary | ICD-10-CM | POA: Diagnosis not present

## 2016-01-25 LAB — CBC
HCT: 40.7 % (ref 36.0–46.0)
Hemoglobin: 12.9 g/dL (ref 12.0–15.0)
MCH: 32.9 pg (ref 26.0–34.0)
MCHC: 31.7 g/dL (ref 30.0–36.0)
MCV: 103.8 fL — ABNORMAL HIGH (ref 78.0–100.0)
PLATELETS: 219 10*3/uL (ref 150–400)
RBC: 3.92 MIL/uL (ref 3.87–5.11)
RDW: 15.2 % (ref 11.5–15.5)
WBC: 7.4 10*3/uL (ref 4.0–10.5)

## 2016-01-25 LAB — COMPREHENSIVE METABOLIC PANEL
ALK PHOS: 96 U/L (ref 38–126)
ALT: 67 U/L — AB (ref 14–54)
AST: 75 U/L — AB (ref 15–41)
Albumin: 2.8 g/dL — ABNORMAL LOW (ref 3.5–5.0)
Anion gap: 5 (ref 5–15)
BILIRUBIN TOTAL: 1.2 mg/dL (ref 0.3–1.2)
CALCIUM: 8.5 mg/dL — AB (ref 8.9–10.3)
CO2: 27 mmol/L (ref 22–32)
CREATININE: 0.71 mg/dL (ref 0.44–1.00)
Chloride: 105 mmol/L (ref 101–111)
GFR calc Af Amer: >60 mL/min (ref >60)
Glucose, Bld: 110 mg/dL — ABNORMAL HIGH (ref 65–99)
Potassium: 3.3 mmol/L — ABNORMAL LOW (ref 3.5–5.1)
Sodium: 137 mmol/L (ref 135–145)
TOTAL PROTEIN: 4.9 g/dL — AB (ref 6.5–8.1)

## 2016-01-25 LAB — HIV ANTIBODY (ROUTINE TESTING W REFLEX): HIV SCREEN 4TH GENERATION: NONREACTIVE

## 2016-01-25 MED ORDER — BOOST / RESOURCE BREEZE PO LIQD
1.0000 | Freq: Three times a day (TID) | ORAL | Status: DC
  Administered 2016-01-25 – 2016-01-27 (×3): 1 via ORAL

## 2016-01-25 MED ORDER — POTASSIUM CHLORIDE CRYS ER 20 MEQ PO TBCR
40.0000 meq | EXTENDED_RELEASE_TABLET | Freq: Once | ORAL | Status: AC
  Administered 2016-01-25: 40 meq via ORAL
  Filled 2016-01-25: qty 2

## 2016-01-25 MED ORDER — IBUPROFEN 600 MG PO TABS
600.0000 mg | ORAL_TABLET | Freq: Four times a day (QID) | ORAL | Status: DC | PRN
Start: 2016-01-25 — End: 2016-01-27
  Administered 2016-01-25: 600 mg via ORAL
  Filled 2016-01-25: qty 1

## 2016-01-25 NOTE — Progress Notes (Addendum)
PROGRESS NOTE    [name]  BJY:[id number] DOB: 08-04-1975 DOA: 01/24/2016 PCP: No PCP Outpatient Specialists: Elissa LovettMelissa Tuttle, LCSWA   Brief Narrative:  40 y.o. female with a history of polysubstance abuse (marijuana, cocaine, EtOH, tobacco, IV drugs) presenting for alcohol withdrawal. She reported drinking on average a fifth of vodka daily, reporting visual hallucinations, HA, body aches, diaphoresis, and anxiety since her last drink 8/22 at 11am. She was seen by her therapist PTA, advised to go to ED, but waited several days. No history of seizures despite abruptly stopping EtOH in the past. She was admitted to medical floor with CIWA protocol, signs of withdrawal were treated with librium and ativan as needed. CSW spoke with patient, and plan after medical stabilization is to enter inpatient rehabilitation program.  Assessment & Plan:   Principal Problem:   Alcohol withdrawal (HCC) Active Problems:   Alcoholism (HCC)   Tobacco abuse   Polysubstance abuse   Metabolic acidosis   Abnormal LFTs   Abdominal pain  Alcohol withdrawal (HCC): She has visual hallucinations, headache, body aches, sweating and anxiety, consistent with alcohol withdraw. No history of seizure from alcohol withdraw.  -wil place on tele bed for obs -CiWA protcol -Schedule hlordiazePOXIDE (LIBRIUM) capsule 5 mg tid -IVF: 1L NS, then D5-1/2 at 100 cc/h  Alcohol dependence and withdrawal: Hemodynamically stable but with CIWA scores indicating ongoing observation and benzodiazepines. No current hallucinations, agitation, or seizure-like activity.  - CIWA protocol - Librium 5mg  TID and ativan prn per protocol - Avoid haldol, tramadol - Monitor electrolytes, replete volume with D5-NS AFTER giving thiamine to prevent  - Thiamine and MVM - Social work consult for substance abuse resources   Hepatic-associated enzyme elevation: Unknown chronicity, suspected alcoholic transaminitis. - Recheck as  outpatient. - Hepatitis panel and HIV pending.  - Avoid hepatotoxins.   Abdominal pain: lipase normal. Physical examination of abdomen is benign, no acute abdomen. 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 - Start Protonix - Zofran prn nausea  Metabolic acidosis: Resolved. With ketonuria, likely related to alcohol abuse and starvation. Lactate and normal.  - IVFs - Recheck BMP  Hypokalemia: Related to GI losses - Replete and recheck along with Mg  Macrocytosis without anemia: Secondary to alcohol abuse - Check B12 as pt reports history of requiring injections in the past, and folate  Tobacco use: - Nicotine patch while inpatient - Cessation counseling provided  Protein calorie mal-nutrition: - Consult to nutrition  DVT prophylaxis: Lovenox Code Status: Full Family Communication: Patient did not wish for me to contact family Disposition Plan: Discharge to home, hopeful for placement at inpatient rehabilitation pending improvement in withdrawal symptoms, likely in next 48 - 72 hours.    Consultants:   None  Procedures: None  Antimicrobials: None  Subjective: Pt reports ongoing tremulousness improved from admission, some anxiety, but focused on getting better. She's stopped cold [place] before without seizures, but symptoms were not this significant. She's denying nausea, emesis, diarrhea. + diaphoresis, anxiety, HA without photophobia or neck stiffness/pain.    Objective: Vitals:   01/24/16 1830 01/24/16 2155 01/25/16 0522 01/25/16 1158  BP: 128/74 111/71 107/61 127/65  Pulse: (!) 46 82 66 67  Resp:  18 18 20   Temp:  98.3 F (36.8 C) 98.1 F (36.7 C) 98.6 F (37 C)  TempSrc:    Oral  SpO2: 100% 99% 99% 100%  Weight:  46.9 kg (103 lb 4.8 oz)    Height:  5\' 5"  (1.651 m)      Intake/Output  Summary (Last 24 hours) at 01/25/16 1617 Last data filed at 01/25/16 1309  Gross per 24 hour  Intake           866.67 ml  Output              950 ml  Net           -83.33 ml    Filed Weights   01/24/16 2155  Weight: 46.9 kg (103 lb 4.8 oz)    Examination:  General exam: 40yo female in no acute distress HEENT: No scleral icterus Respiratory system: Clear to auscultation. Respiratory effort normal. Cardiovascular system: S1 & S2 heard, RRR. No JVD, murmurs, rubs, gallops or clicks. No pedal edema. Gastrointestinal system: Abdomen is nondistended, soft and nontender. No organomegaly or masses felt. Normal bowel sounds heard. Central nervous system: Alert and oriented. No focal neurological deficits. Mild intention tremor bilaterally without asterixis. Extremities: Symmetric 5 x 5 power. Skin: No rashes, lesions or ulcers. No jaundice.  Psychiatry: Judgement and insight appear moderately impaired. Mood anxious, affect broad. No SI/HI.   Data Reviewed: I have personally reviewed following labs and imaging studies  CBC:  Recent Labs Lab 01/24/16 1454 01/25/16 0532  WBC 9.1 7.4  HGB 15.1* 12.9  HCT 46.3* 40.7  MCV 104.5* 103.8*  PLT 300 219   Basic Metabolic Panel:  Recent Labs Lab 01/24/16 1454 01/25/16 0532  NA 137 137  K 4.3 3.3*  CL 99* 105  CO2 20* 27  GLUCOSE 73 110*  BUN 5* <5*  CREATININE 0.80 0.71  CALCIUM 9.5 8.5*   GFR: Estimated Creatinine Clearance: 69.2 mL/min (by C-G formula based on SCr of 0.8 mg/dL). Liver Function Tests:  Recent Labs Lab 01/24/16 1454 01/25/16 0532  AST 109* 75*  ALT 95* 67*  ALKPHOS 131* 96  BILITOT 2.0* 1.2  PROT 7.0 4.9*  ALBUMIN 4.0 2.8*    Recent Labs Lab 01/24/16 2045  LIPASE 15   No results for input(s): AMMONIA in the last 168 hours. Coagulation Profile: No results for input(s): INR, PROTIME in the last 168 hours. Cardiac Enzymes: No results for input(s): CKTOTAL, CKMB, CKMBINDEX, TROPONINI in the last 168 hours. BNP (last 3 results) No results for input(s): PROBNP in the last 8760 hours. HbA1C: No results for input(s): HGBA1C in the last 72 hours. CBG: No results for  input(s): GLUCAP in the last 168 hours. Lipid Profile: No results for input(s): CHOL, HDL, LDLCALC, TRIG, CHOLHDL, LDLDIRECT in the last 72 hours. Thyroid Function Tests: No results for input(s): TSH, T4TOTAL, FREET4, T3FREE, THYROIDAB in the last 72 hours. Anemia Panel: No results for input(s): VITAMINB12, FOLATE, FERRITIN, TIBC, IRON, RETICCTPCT in the last 72 hours. Urine analysis:    Component Value Date/Time   COLORURINE AMBER (A) 01/24/2016 1948   APPEARANCEUR CLOUDY (A) 01/24/2016 1948   LABSPEC 1.025 01/24/2016 1948   PHURINE 6.0 01/24/2016 1948   GLUCOSEU NEGATIVE 01/24/2016 1948   HGBUR NEGATIVE 01/24/2016 1948   BILIRUBINUR LARGE (A) 01/24/2016 1948   KETONESUR >80 (A) 01/24/2016 1948   PROTEINUR NEGATIVE 01/24/2016 1948   UROBILINOGEN 0.2 03/02/2015 1245   NITRITE NEGATIVE 01/24/2016 1948   LEUKOCYTESUR TRACE (A) 01/24/2016 1948   Sepsis Labs: @LABRCNTIP (procalcitonin:4,lacticidven:4)  )No results found for this or any previous visit (from the past 240 hour(s)).       Radiology Studies: No results found.      Scheduled Meds: . chlordiazePOXIDE  5 mg Oral TID  . enoxaparin (LOVENOX) injection  40 mg  Subcutaneous Q24H  . folic acid  1 mg Oral Daily  . LORazepam  0-4 mg Intravenous Q6H   Followed by  . [START ON 01/27/2016] LORazepam  0-4 mg Intravenous Q12H  . multivitamin with minerals  1 tablet Oral Daily  . nicotine  21 mg Transdermal Daily  . pantoprazole  40 mg Oral Q1200  . sodium chloride flush  3 mL Intravenous Q12H  . thiamine  100 mg Oral Daily   Continuous Infusions: . dextrose 5 % and 0.45% NaCl 100 mL/hr at 01/25/16 1307     LOS: 0 days   Time spent: 25 min  Hazeline Junker, MD [facility] Pager [phone]  If 7PM-7AM, please contact night-coverage [email or web address] Password [facility] 01/25/2016, 4:17 PM

## 2016-01-25 NOTE — Progress Notes (Signed)
Patient will need h&p and MD progress note stating patient is stable and no longer withdrawing [facility] [phone]([phone]).  [facility] Patient can admit there on Monday at 11:45am as long as she becomes stable over the weekend.  Osborne Cascoadia Celene Pippins LCSWA [phone]
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
## 2016-01-25 NOTE — Progress Notes (Signed)
Initial Nutrition Assessment  DOCUMENTATION CODES:   Underweight  INTERVENTION:   -Boost Breeze po TID, each supplement provides 250 kcal and 9 grams of protein  NUTRITION DIAGNOSIS:   Inadequate oral intake related to poor appetite as evidenced by meal completion < 25%.  GOAL:   Patient will meet greater than or equal to 90% of their needs  MONITOR:   PO intake, Supplement acceptance, Labs, Weight trends, Skin, I & O's  REASON FOR ASSESSMENT:   Malnutrition Screening Tool, Consult Assessment of nutrition requirement/status  ASSESSMENT:   [name] is a 40 y.o. female with medical history significant of Polysubstance abuse including marijuana, cocaine, alcohol, tobacco abuse, IV drug user, who presents with alcohol problem and abdominal pain.  Pt admitted with alcohol withdrawal.   Per chart review, pt drinks a fifth of vodka daily. Toxicology positive for cocaine, benzos, and opiates.   Spoke with pt at the bedside, who was cooperative during assessment, however, did not provide many detailed answers even when probed. Pt reports she has had a poor appetite over the past several months. Diet recall was very difficult to elicit from pt, other than she has not felt like eating. Noted breakfast tray has been unattempted. She shares she has been sipping on water without difficulty. She dislikes Ensure and reports she had abdominal pain from drinking.   Pt reports her UBW is usually between 100-105#. Noted wt loss > than one year ago. Wt has been relatively stable over the past 6 months. Pt reports she has always been petite. She has noticed she has less stamina than usual.   Nutrition-Focused physical exam completed. Findings are no fat depletion, no muscle depletion, and no edema. Pt is very petite and small framed.   Pt reluctantly agreed to try Boost Breeze supplement , which RD provided to her to try when she felt ready. Pt requesting "broth and soup". RD reinforced  importance of protein intake to assist with healing.   RD unable to identify malnutrition at this time, however, pt is at risk for malnutrition given recent poor oral intake and substance abuse.   Medications reviewed and include MVI, vitamin B-1, and folate.   Labs reviewed: K: 3.3, Lipase WDL.  Diet Order:  Diet regular Room service appropriate? Yes; Fluid consistency: Thin  Skin:  Reviewed, no issues  Last BM:  PTA  Height:   Ht Readings from Last 1 Encounters:  01/24/16 5\' 5"  (1.651 m)    Weight:   Wt Readings from Last 1 Encounters:  01/24/16 103 lb 4.8 oz (46.9 kg)    Ideal Body Weight:  56.8 kg  BMI:  Body mass index is 17.19 kg/m.  Estimated Nutritional Needs:   Kcal:  1400-1600  Protein:  60-75 grams  Fluid:  1.4-1.6 L  EDUCATION NEEDS:   Education needs addressed  Joliene Salvador A. Mayford KnifeWilliams, RD, LDN, CDE Pager: [phone] After hours Pager: [phone]
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
## 2016-01-26 LAB — BASIC METABOLIC PANEL
Anion gap: 6 (ref 5–15)
BUN: <5 mg/dL — ABNORMAL LOW (ref 6–20)
CHLORIDE: 107 mmol/L (ref 101–111)
CO2: 23 mmol/L (ref 22–32)
CREATININE: 0.47 mg/dL (ref 0.44–1.00)
Calcium: 8.4 mg/dL — ABNORMAL LOW (ref 8.9–10.3)
GFR calc non Af Amer: >60 mL/min (ref >60)
GLUCOSE: 113 mg/dL — AB (ref 65–99)
Potassium: 3.1 mmol/L — ABNORMAL LOW (ref 3.5–5.1)
Sodium: 136 mmol/L (ref 135–145)

## 2016-01-26 LAB — MAGNESIUM: Magnesium: 1.6 mg/dL — ABNORMAL LOW (ref 1.7–2.4)

## 2016-01-26 LAB — HEPATITIS PANEL, ACUTE
HCV Ab: <0.1 s/co ratio (ref 0.0–0.9)
HEP B S AG: NEGATIVE
Hep A IgM: NEGATIVE
Hep B C IgM: NEGATIVE

## 2016-01-26 LAB — VITAMIN B12: VITAMIN B 12: 543 pg/mL (ref 180–914)

## 2016-01-26 MED ORDER — POTASSIUM CHLORIDE CRYS ER 20 MEQ PO TBCR
40.0000 meq | EXTENDED_RELEASE_TABLET | Freq: Once | ORAL | Status: AC
  Administered 2016-01-26: 40 meq via ORAL
  Filled 2016-01-26: qty 2

## 2016-01-26 MED ORDER — CHLORDIAZEPOXIDE HCL 5 MG PO CAPS
5.0000 mg | ORAL_CAPSULE | Freq: Two times a day (BID) | ORAL | Status: AC
  Administered 2016-01-26 – 2016-01-27 (×2): 5 mg via ORAL
  Filled 2016-01-26 (×2): qty 1

## 2016-01-26 MED ORDER — MAGNESIUM CHLORIDE 64 MG PO TBEC
1.0000 | DELAYED_RELEASE_TABLET | Freq: Once | ORAL | Status: AC
  Administered 2016-01-26: 64 mg via ORAL
  Filled 2016-01-26: qty 1

## 2016-01-26 NOTE — Progress Notes (Signed)
PROGRESS NOTE    [name]  JWJ:[id number] DOB: 03/31/1976 DOA: 01/24/2016 PCP: No PCP Outpatient Specialists: Elissa Lovett, LCSWA   Brief Narrative:  40 y.o. female with a history of polysubstance abuse (marijuana, cocaine, EtOH, tobacco, IV drugs) presenting for alcohol withdrawal. She reported drinking on average a fifth of vodka daily, reporting visual hallucinations, HA, body aches, diaphoresis, and anxiety since her last drink 8/22 at 11am. She was seen by her therapist PTA, advised to go to ED, but waited several days. No history of seizures despite abruptly stopping EtOH in the past. She was admitted to medical floor with CIWA protocol, signs of withdrawal were treated with librium and ativan as needed. CSW spoke with patient, and plan after medical stabilization is to enter inpatient rehabilitation program.  Assessment & Plan:   Principal Problem:   Alcohol withdrawal (HCC) Active Problems:   Alcoholism (HCC)   Tobacco abuse   Polysubstance abuse   Metabolic acidosis   Abnormal LFTs   Abdominal pain  Alcohol dependence and withdrawal: Hemodynamically stable but with CIWA scores indicating ongoing observation and benzodiazepines. No current hallucinations, agitation, or seizure-like activity.  - CIWA protocol - Librium tapering to 5mg  BID, then daily, ativan prn per protocol - Avoid haldol, tramadol - Thiamine and MVM - Social work consult for substance abuse resources - Patient has appointment for intake into [facility] on 8/28. They will require medical clearance prior to that time.    Hepatic-associated enzyme elevation: Unknown chronicity, suspected alcoholic transaminitis. - Recheck as outpatient. - Hepatitis panel and HIV neg - Avoid hepatotoxins.   Abdominal pain: lipase normal. Physical examination of abdomen is benign, no acute abdomen. 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 - Start Protonix - Zofran prn nausea  Metabolic acidosis: Resolved. With ketonuria,  likely related to alcohol abuse and starvation. Lactate and normal.  - IVFs - Recheck BMP  Hypokalemia: Related to GI losses, concomitant hypomagnesemia. - Replete along with Mg - Recheck in AM  Macrocytosis without anemia: Secondary to alcohol abuse - B12 wnl  Tobacco use: - Nicotine patch 21mg /day while inpatient - Cessation counseling provided  Protein calorie mal-nutrition: - Consult to nutrition: Supplementing protein  DVT prophylaxis: Lovenox Code Status: Full Family Communication: Patient did not wish for me to contact family Disposition Plan: Discharge to home, hopeful for placement at inpatient rehabilitation pending improvement in withdrawal symptoms, likely in next 48 - 72 hours.   Consultants:   None  Procedures: None  Antimicrobials: None  Subjective: Continues to have tremulousness improving from yesterday. Mostly preoccupied with anxiety over her social situation. Continues to deny nausea, emesis, diarrhea. + diaphoresis, anxiety, HA without photophobia or neck stiffness/pain.    Objective: Vitals:   01/25/16 1736 01/25/16 2200 01/26/16 0008 01/26/16 0529  BP: 126/71 128/68 118/67 115/68  Pulse: 78 79 60 (!) 57  Resp: 20  16 16   Temp: 98.2 F (36.8 C)  98.5 F (36.9 C) 98 F (36.7 C)  TempSrc: Oral  Oral Oral  SpO2: 97%  99% 99%  Weight:      Height:        Intake/Output Summary (Last 24 hours) at 01/26/16 1230 Last data filed at 01/26/16 1046  Gross per 24 hour  Intake          2143.33 ml  Output             3550 ml  Net         -1406.67 ml   Filed Weights   01/24/16  2155  Weight: 46.9 kg (103 lb 4.8 oz)   Examination: General exam: 40 yo female in no acute distress HEENT: No scleral icterus Respiratory system: Clear to auscultation. Respiratory effort normal. Cardiovascular system: S1 & S2 heard, RRR. No JVD, murmurs, rubs, gallops or clicks. No pedal edema. Gastrointestinal system: Abdomen is nondistended, soft and nontender. No  organomegaly or masses felt. Normal bowel sounds heard. Central nervous system: Alert and oriented. No focal neurological deficits. Mild intention tremor bilaterally without asterixis. Extremities: No deformities.  Skin: No rashes, lesions or ulcers. No jaundice.  Psychiatry: Judgement and insight appear moderately impaired. Mood anxious, affect broad. No SI/HI.   Data Reviewed: I have personally reviewed following labs and imaging studies  CBC:  Recent Labs Lab 01/24/16 1454 01/25/16 0532  WBC 9.1 7.4  HGB 15.1* 12.9  HCT 46.3* 40.7  MCV 104.5* 103.8*  PLT 300 219   Basic Metabolic Panel:  Recent Labs Lab 01/24/16 1454 01/25/16 0532 01/26/16 0452  NA 137 137 136  K 4.3 3.3* 3.1*  CL 99* 105 107  CO2 20* 27 23  GLUCOSE 73 110* 113*  BUN 5* <5* <5*  CREATININE 0.80 0.71 0.47  CALCIUM 9.5 8.5* 8.4*  MG  --   --  1.6*   GFR: Estimated Creatinine Clearance: 69.2 mL/min (by C-G formula based on SCr of 0.8 mg/dL). Liver Function Tests:  Recent Labs Lab 01/24/16 1454 01/25/16 0532  AST 109* 75*  ALT 95* 67*  ALKPHOS 131* 96  BILITOT 2.0* 1.2  PROT 7.0 4.9*  ALBUMIN 4.0 2.8*    Recent Labs Lab 01/24/16 2045  LIPASE 15   No results for input(s): AMMONIA in the last 168 hours. Coagulation Profile: No results for input(s): INR, PROTIME in the last 168 hours. Cardiac Enzymes: No results for input(s): CKTOTAL, CKMB, CKMBINDEX, TROPONINI in the last 168 hours. BNP (last 3 results) No results for input(s): PROBNP in the last 8760 hours. HbA1C: No results for input(s): HGBA1C in the last 72 hours. CBG: No results for input(s): GLUCAP in the last 168 hours. Lipid Profile: No results for input(s): CHOL, HDL, LDLCALC, TRIG, CHOLHDL, LDLDIRECT in the last 72 hours. Thyroid Function Tests: No results for input(s): TSH, T4TOTAL, FREET4, T3FREE, THYROIDAB in the last 72 hours. Anemia Panel:  Recent Labs  01/26/16 0452  VITAMINB12 543   Urine analysis:      Component Value Date/Time   COLORURINE AMBER (A) 01/24/2016 1948   APPEARANCEUR CLOUDY (A) 01/24/2016 1948   LABSPEC 1.025 01/24/2016 1948   PHURINE 6.0 01/24/2016 1948   GLUCOSEU NEGATIVE 01/24/2016 1948   HGBUR NEGATIVE 01/24/2016 1948   BILIRUBINUR LARGE (A) 01/24/2016 1948   KETONESUR >80 (A) 01/24/2016 1948   PROTEINUR NEGATIVE 01/24/2016 1948   UROBILINOGEN 0.2 03/02/2015 1245   NITRITE NEGATIVE 01/24/2016 1948   LEUKOCYTESUR TRACE (A) 01/24/2016 1948   Sepsis Labs: @LABRCNTIP (procalcitonin:4,lacticidven:4)  )No results found for this or any previous visit (from the past 240 hour(s)).   Radiology Studies: No results found.  Scheduled Meds: . chlordiazePOXIDE  5 mg Oral TID  . enoxaparin (LOVENOX) injection  40 mg Subcutaneous Q24H  . feeding supplement  1 Container Oral TID BM  . folic acid  1 mg Oral Daily  . LORazepam  0-4 mg Intravenous Q6H   Followed by  . [START ON 01/27/2016] LORazepam  0-4 mg Intravenous Q12H  . magnesium chloride  1 tablet Oral Once  . multivitamin with minerals  1 tablet Oral Daily  .  nicotine  21 mg Transdermal Daily  . pantoprazole  40 mg Oral Q1200  . potassium chloride  40 mEq Oral Once  . sodium chloride flush  3 mL Intravenous Q12H  . thiamine  100 mg Oral Daily   Continuous Infusions: . dextrose 5 % and 0.45% NaCl 100 mL/hr at 01/26/16 0706     LOS: 1 day   Time spent: 25 min  Hazeline Junker, MD [facility] Pager [phone]  If 7PM-7AM, please contact night-coverage [email or web address] Password [email or web address] 01/26/2016, 12:30 PM

## 2016-01-27 DIAGNOSIS — R636 Underweight: Secondary | ICD-10-CM

## 2016-01-27 DIAGNOSIS — F10230 Alcohol dependence with withdrawal, uncomplicated: Secondary | ICD-10-CM

## 2016-01-27 LAB — BASIC METABOLIC PANEL
Anion gap: 6 (ref 5–15)
BUN: <5 mg/dL — ABNORMAL LOW (ref 6–20)
CHLORIDE: 106 mmol/L (ref 101–111)
CO2: 27 mmol/L (ref 22–32)
CREATININE: 0.64 mg/dL (ref 0.44–1.00)
Calcium: 8.8 mg/dL — ABNORMAL LOW (ref 8.9–10.3)
GFR calc non Af Amer: >60 mL/min (ref >60)
GLUCOSE: 115 mg/dL — AB (ref 65–99)
Potassium: 3.3 mmol/L — ABNORMAL LOW (ref 3.5–5.1)
Sodium: 139 mmol/L (ref 135–145)

## 2016-01-27 NOTE — Discharge Summary (Signed)
Physician Discharge Summary  [name] ZOX:[id number] DOB: 21-Jun-1975 DOA: 01/24/2016  PCP: No PCP Per Patient  Admit date: 01/24/2016 Discharge date: 01/27/2016  Admitted From: Home Disposition:  Home, then [facility] inpatient admission 8/28.   Recommendations for Outpatient Follow-up:  1. Follow up with PCP in 1-2 weeks 2. Please obtain CMP to monitor LFT's (mildly elevated on admission, negative Hep panel and HIV).  3. 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 B12 level was in the 500's here.   Home Health: None Equipment/Devices: None  Discharge Condition: Stable CODE STATUS: Full Diet recommendation: Regular, abstain from psychotropic medications and substances  Brief/Interim Summary: 40 y.o.femalewith a history of polysubstance abuse (marijuana, cocaine, EtOH, tobacco, IV drugs) presenting for alcohol withdrawal. She reported drinking on average a fifth of vodka daily, reporting visual hallucinations, HA, body aches, diaphoresis, and anxiety since her last drink 8/22 at 11am. She was seen by her therapist PTA, advised to go to ED, but waited several days. No history of seizures despite abruptly stopping EtOH in the past. She was admitted to medical floor with CIWA protocol, signs of withdrawal were treated with librium and ativan as needed. CSW spoke with patient, and plan after medical stabilization is to enter inpatient rehabilitation program on 8/28. She is medically stable as of 8/26, having tapered off benzodiazepines without return of withdrawal symptoms, and will be discharged.  Discharge Diagnoses:  Principal Problem:   Alcohol withdrawal (HCC) Active Problems:   Alcoholism (HCC)   Tobacco abuse   Polysubstance abuse   Metabolic acidosis   Abnormal LFTs   Abdominal pain   Underweight   Protein calorie mal-nutrition, mild: - Consult to nutrition: Supplementing protein  Discharge  Instructions  Discharge Instructions    Discharge instructions    Complete by:  As directed   You were treated for withdrawal from alcohol and are now medically stable for discharge on the morning of 01/27/2016. No medications are prescribed on discharge. Further recommendations include: Total abstinence from alcohol and all illicit and mind-altering substances, reach out to social supports who are sober and who support your desire to stop drinking, and present to [facility] on 8/28 as planned.   If there are any questions regarding this admission at discharge, please let us know. This should serve as a letter of medical clearance by your attending physician, Dr. Jarvis Newcomer.   Hazeline Junker, MD 01/27/2016 10:09 AM       Medication List    You have not been prescribed any medications.    Follow-up Information    [facility] Follow up on 01/29/2016.   Why:  Or other locations throughout [facility] Contact information: [address] [place] [address] [phone]          Allergies  Allergen Reactions  . Eggs Or Egg-Derived Products Nausea Only    Consultations:  None  Procedures/Studies: None   Subjective: Pt ready to go home, has not required ativan in past 24 hours. Tremors improved. No diaphoresis, N/V/D. Eating well.   Discharge Exam: Vitals:   01/26/16 2214 01/27/16 0618  BP: (!) 119/57 123/65  Pulse: 71 60  Resp: 20 18  Temp: 98.2 F (36.8 C) 97.6 F (36.4 C)   Vitals:   01/26/16 0529 01/26/16 1259 01/26/16 2214 01/27/16 0618  BP: 115/68 (!) 123/92 (!) 119/57 123/65  Pulse: (!) 57 75 71 60  Resp: 16 16 20 18   Temp: 98  F (36.7 C) 98.9 F (37.2 C) 98.2 F (36.8 C) 97.6 F (36.4 C)  TempSrc: Oral     SpO2: 99% 100% 98% 99%  Weight:      Height:        General: Pt is alert, awake, not in acute distress Cardiovascular: RRR, S1/S2 +, no rubs, no gallops Respiratory: CTA bilaterally, no wheezing, no rhonchi Abdominal: Soft, NT, ND,  bowel sounds + Extremities: no edema, no cyanosis  The results of significant diagnostics from this hospitalization (including imaging, microbiology, ancillary and laboratory) are listed below for reference.     Microbiology: No results found for this or any previous visit (from the past 240 hour(s)).   Labs: BNP (last 3 results) No results for input(s): BNP in the last 8760 hours. Basic Metabolic Panel:  Recent Labs Lab 01/24/16 1454 01/25/16 0532 01/26/16 0452 01/27/16 0632  NA 137 137 136 139  K 4.3 3.3* 3.1* 3.3*  CL 99* 105 107 106  CO2 20* 27 23 27   GLUCOSE 73 110* 113* 115*  BUN 5* <5* <5* <5*  CREATININE 0.80 0.71 0.47 0.64  CALCIUM 9.5 8.5* 8.4* 8.8*  MG  --   --  1.6*  --    Liver Function Tests:  Recent Labs Lab 01/24/16 1454 01/25/16 0532  AST 109* 75*  ALT 95* 67*  ALKPHOS 131* 96  BILITOT 2.0* 1.2  PROT 7.0 4.9*  ALBUMIN 4.0 2.8*    Recent Labs Lab 01/24/16 2045  LIPASE 15   No results for input(s): AMMONIA in the last 168 hours. CBC:  Recent Labs Lab 01/24/16 1454 01/25/16 0532  WBC 9.1 7.4  HGB 15.1* 12.9  HCT 46.3* 40.7  MCV 104.5* 103.8*  PLT 300 219   Cardiac Enzymes: No results for input(s): CKTOTAL, CKMB, CKMBINDEX, TROPONINI in the last 168 hours. BNP: Invalid input(s): POCBNP CBG: No results for input(s): GLUCAP in the last 168 hours. D-Dimer No results for input(s): DDIMER in the last 72 hours. Hgb A1c No results for input(s): HGBA1C in the last 72 hours. Lipid Profile No results for input(s): CHOL, HDL, LDLCALC, TRIG, CHOLHDL, LDLDIRECT in the last 72 hours. Thyroid function studies No results for input(s): TSH, T4TOTAL, T3FREE, THYROIDAB in the last 72 hours.  Invalid input(s): FREET3 Anemia work up  Recent Labs  01/26/16 0452  VITAMINB12 543   Urinalysis    Component Value Date/Time   COLORURINE AMBER (A) 01/24/2016 1948   APPEARANCEUR CLOUDY (A) 01/24/2016 1948   LABSPEC 1.025 01/24/2016 1948    PHURINE 6.0 01/24/2016 1948   GLUCOSEU NEGATIVE 01/24/2016 1948   HGBUR NEGATIVE 01/24/2016 1948   BILIRUBINUR LARGE (A) 01/24/2016 1948   KETONESUR >80 (A) 01/24/2016 1948   PROTEINUR NEGATIVE 01/24/2016 1948   UROBILINOGEN 0.2 03/02/2015 1245   NITRITE NEGATIVE 01/24/2016 1948   LEUKOCYTESUR TRACE (A) 01/24/2016 1948   Sepsis Labs Invalid input(s): PROCALCITONIN,  WBC,  LACTICIDVEN Microbiology No results found for this or any previous visit (from the past 240 hour(s)).   Time coordinating discharge: Over 30 minutes  SIGNED:   Hazeline Junkeryan Junnie Loschiavo, MD  [facility] 01/27/2016, 1:53 PM Pager   If 7PM-7AM, please contact night-coverage [email or web address] Password [email or web address]

## 2016-01-29 LAB — FOLATE RBC
FOLATE, HEMOLYSATE: 283.3 ng/mL
Folate, RBC: 749 ng/mL (ref >498)
HEMATOCRIT: 37.8 % (ref 34.0–46.6)

## 2016-01-29 NOTE — Progress Notes (Signed)
[facility].  CSW signing off.  Osborne Cascoadia Bader Stubblefield LCSWA [phone]

## 2017-08-31 IMAGING — US US MFM OB TRANSVAGINAL
1 series · 14 of 28 positions shown · non-contrast
Comparison: none

[Series 1: us mfm ob transvaginal · 14 of 53 slices shown]
[im 2/53]
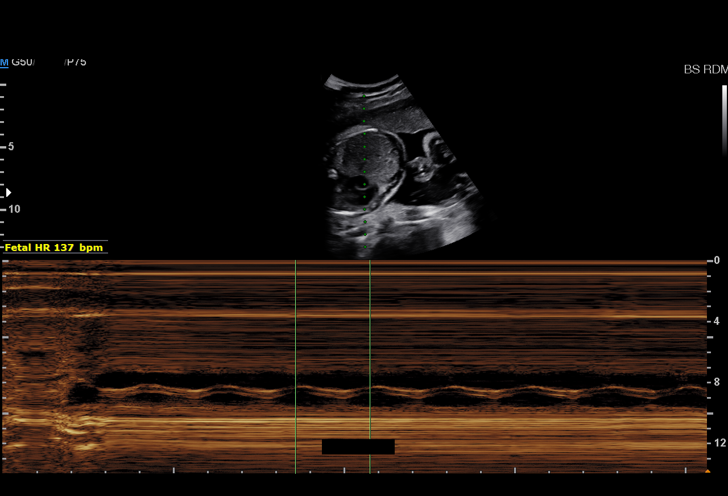
[im 6/53]
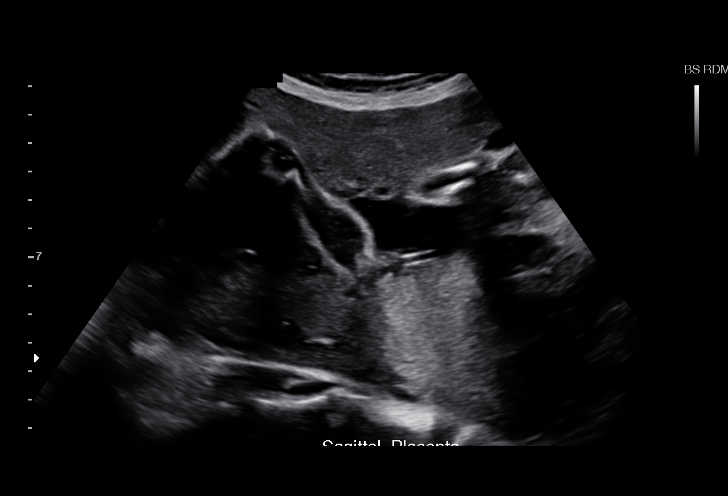
[im 10/53]
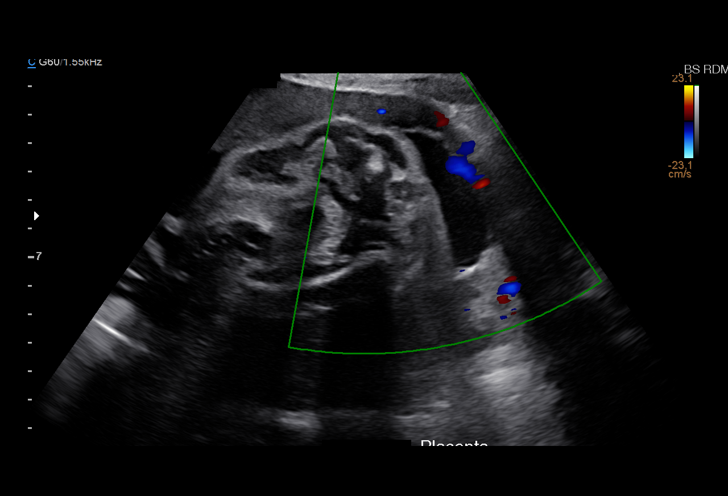
[im 14/53]
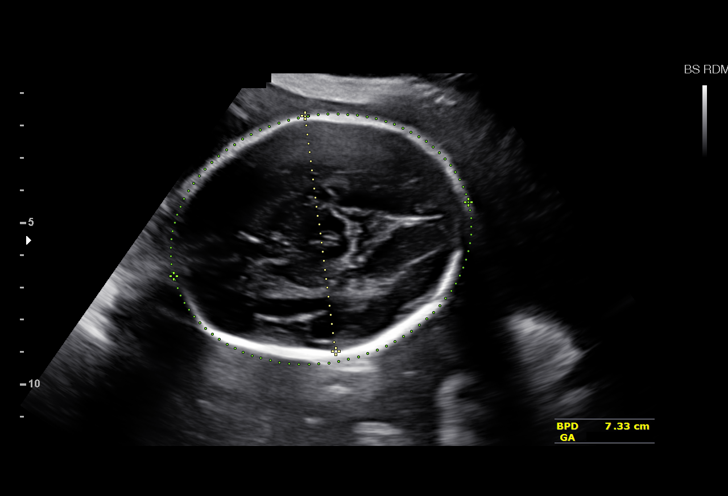
[im 18/53]
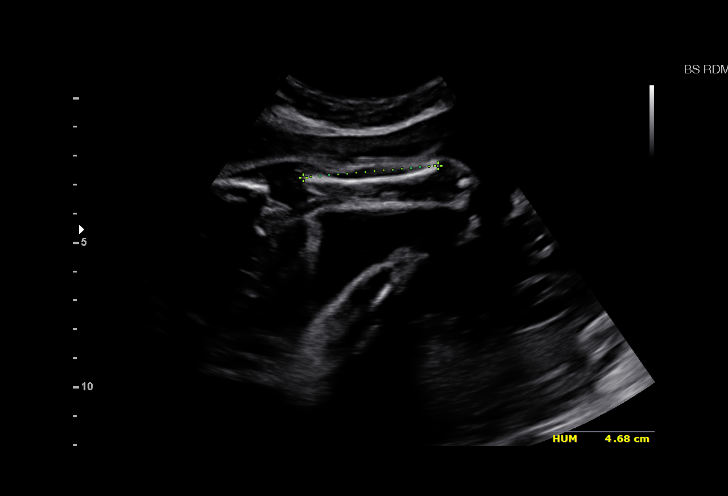
[im 22/53]
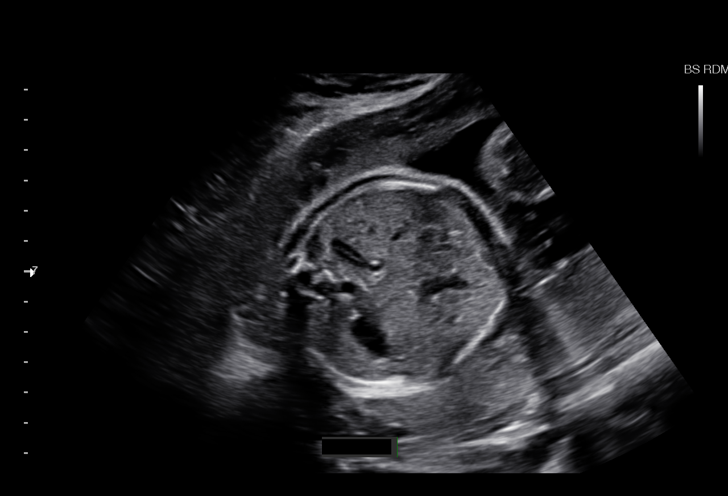
[im 26/53]
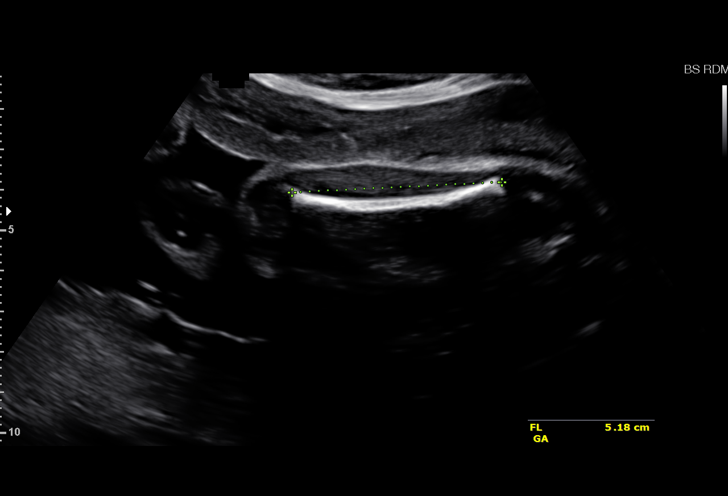
[im 29/53]
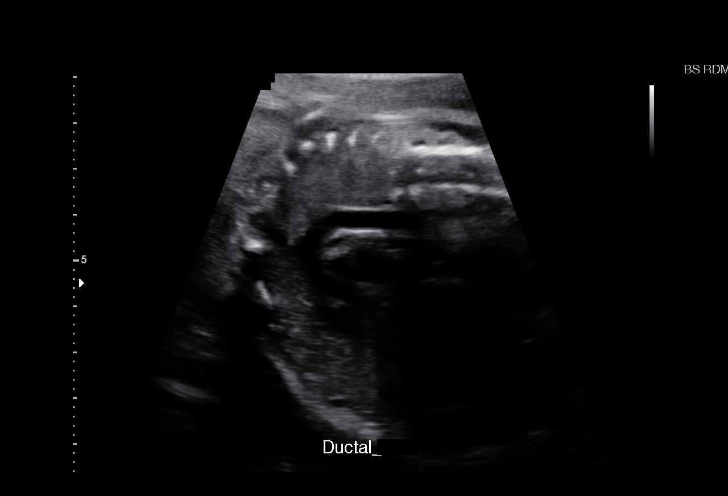
[im 33/53]
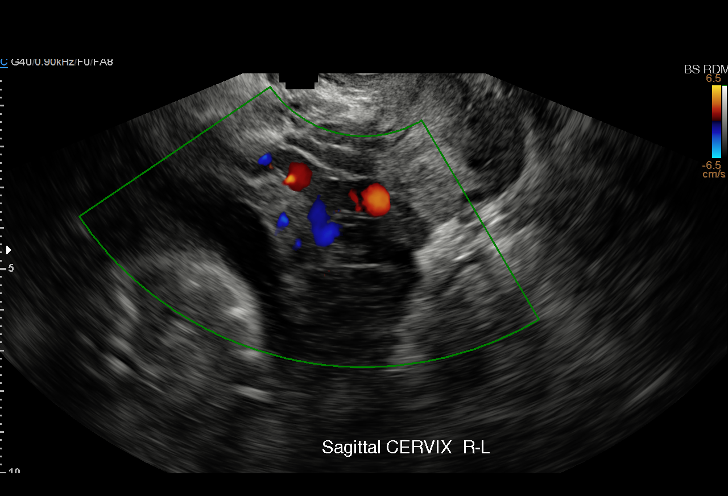
[im 37/53]
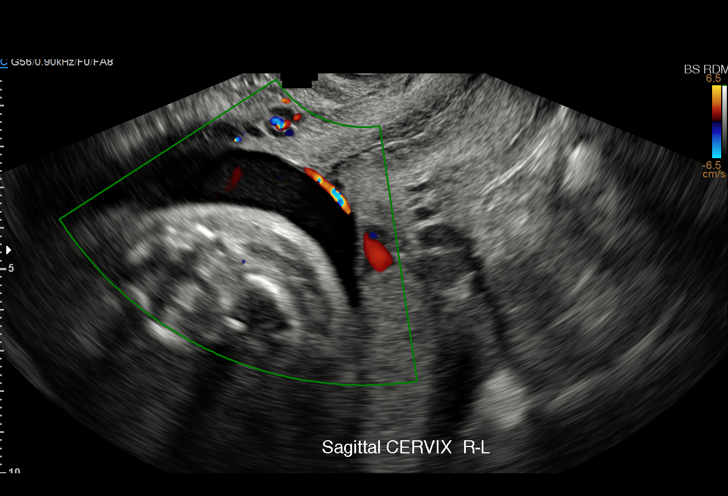
[im 41/53]
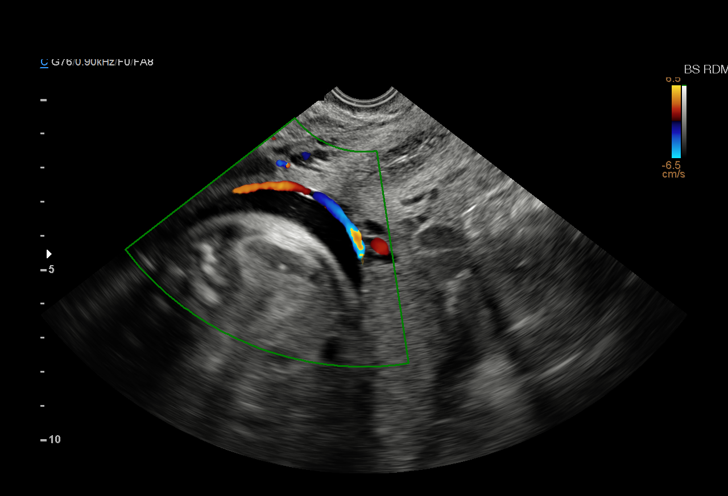
[im 45/53]
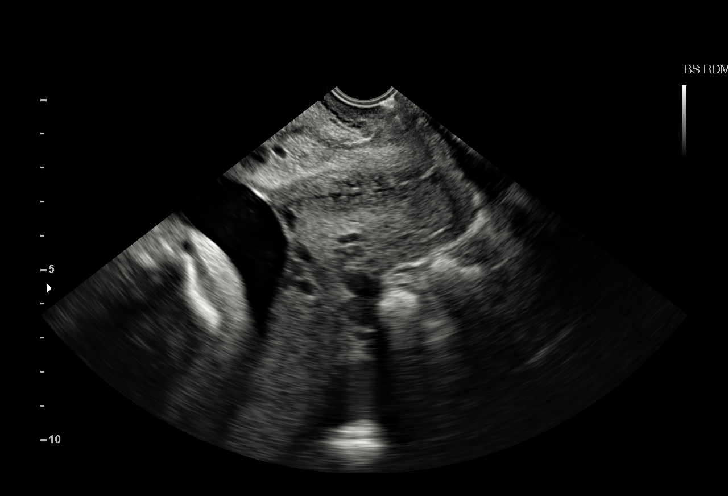
[im 49/53]
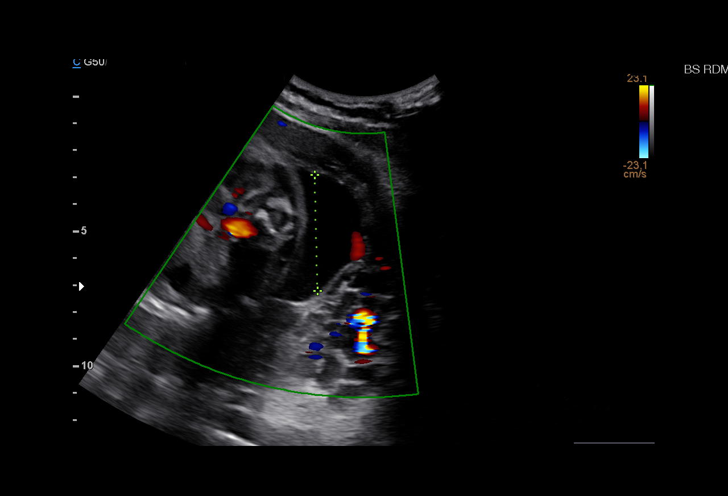
[im 53/53]
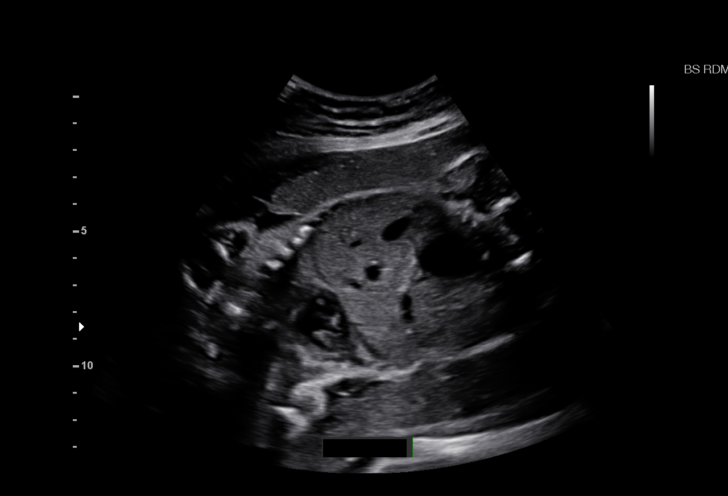

[14 of 28 positions shown; findings below may reference images not displayed]

OBSTETRICS REPORT
(Signed Final 02/08/2015 [DATE])

Service(s) Provided

US MFM OB TRANSVAGINAL                                76817.2
Indications

Advanced maternal age multigravida 35+, second
trimester (38); declined testing
Gestational diabetes in pregnancy, diet controlled
Vasa previa
28 weeks gestation of pregnancy
Fetal Evaluation

Num Of Fetuses:    1
Fetal Heart Rate:  137                          bpm
Cardiac Activity:  Observed
Presentation:      Breech
Placenta:          Posterior, low-lying:
succ lobe
P. Cord            Previously Visualized
Insertion:

Amniotic Fluid
AFI FV:      Subjectively within normal limits
AFI Sum:     19.12    cm      74  %Tile      Larg Pckt:   6.03  cm
RUQ:   5.08    cm   RLQ:    4.3    cm    LUQ:    6.03   cm    LLQ:   3.71    cm
Biometry

BPD:     73.6   mm    G. Age:  29w 4d                CI:          75.4   70 - 86
FL/HC:       19.3   19.6 -
20.8
HC:     268.8   mm    G. Age:  29w 2d       35   %   HC/AC:       1.11   0.99 -
1.21
AC:     242.3   mm    G. Age:  28w 4d       37   %   FL/BPD:      70.4   71 - 87
FL:      51.8   mm    G. Age:  27w 4d       11   %   FL/AC:       21.4   20 - 24
HUM:     46.7   mm    G. Age:  27w 4d       19   %

Est. FW:    9890   gm    2 lb 11 oz     44  %
Gestational Age

LMP:           30w 2d        Date:  07/06/14                 EDD:    04/12/15
U/S Today:     28w 5d                                        EDD:    04/23/15
Best:          28w 5d     Det. By:  Early Ultrasound         EDD:    04/23/15
(12/02/14)
Anatomy

Cranium:          Appears normal         Aortic Arch:       Previously seen
Fetal Cavum:      Previously seen        Ductal Arch:       Not well visualized
Ventricles:       Appears normal         Diaphragm:         Previously seen
Choroid Plexus:   Previously seen        Stomach:           Appears normal, left
sided
Cerebellum:       Previously seen        Abdomen:           Appears normal
Posterior Fossa:  Previously seen        Abdominal Wall:    Previously seen
Nuchal Fold:      Not applicable (>20    Cord Vessels:      Previously seen
wks GA)
Face:             Orbits and profile     Kidneys:           Appear normal
previously seen
Lips:             Previously seen        Bladder:           Appears normal
Palate:           Previously seen        Spine:             Previously seen
RVOT:             Previously seen        Lower              Previously seen
Extremities:
LVOT:             Previously seen        Upper              Previously seen
Extremities:

Other:  Female gender previously seen. Heels and 5th digit previously seen.
Targeted Anatomy

Fetal Central Nervous System
Lat. Ventricles:
Cervix Uterus Adnexa

Cervical Length:    4.84      cm

Cervix:       Normal appearance by transvaginal scan
Impression

SIUP at 28+5 weeks
Normal interval anatomy; anatomic survey complete except
for DA
Normal amniotic fluid volume
Appropriate interval growth with EFW at the 44th %tile
EV views of cervix: normal length without funneling; fetal
vessels again seen crossing the cervix i.e. vasa previa
Low-lying posterior placenta with anterior succenturiate lobe
Recommendations

Follow-up ultrasound in 2 weeks to reassess cervix and fetal
vessels
Consider hospitalization between 30 and 32 weeks
Deliver between 34-36 weeks by C/S
BMZ prior to delivery
Ms. Vaught to decide location of delivery - [facility] or [facility]

questions or concerns.

## 2017-10-09 IMAGING — US US MFM OB FOLLOW-UP
1 series · 14 of 28 positions shown · non-contrast
Comparison: none

[Series 1: us mfm ob follow-up · 14 of 37 slices shown]
[im 2/37]
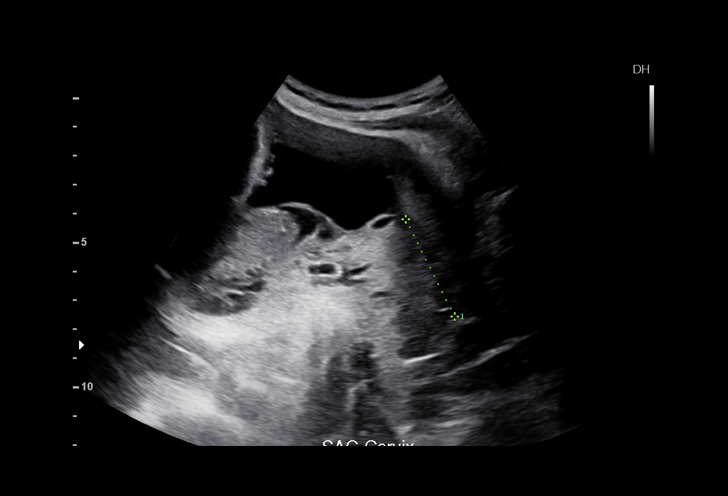
[im 5/37]
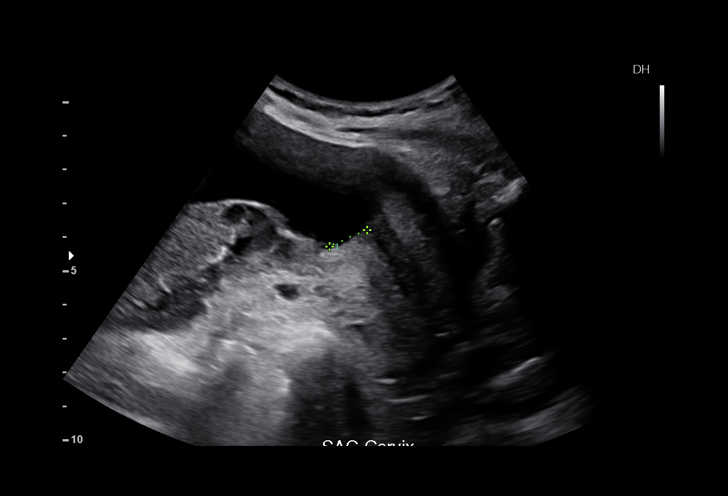
[im 7/37]
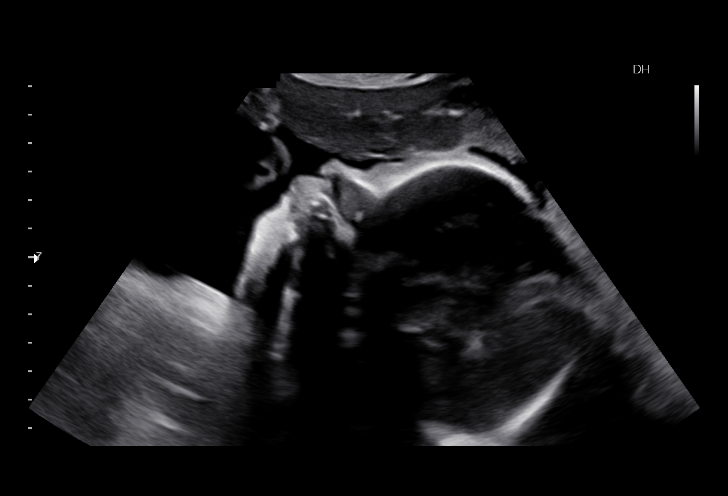
[im 10/37]
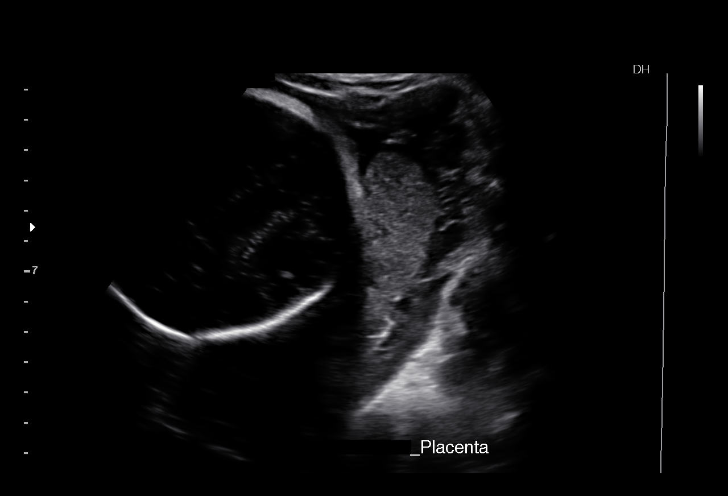
[im 13/37]
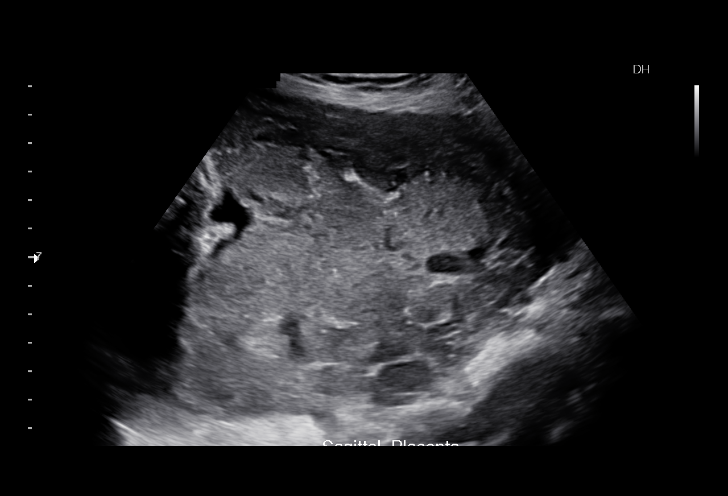
[im 15/37]
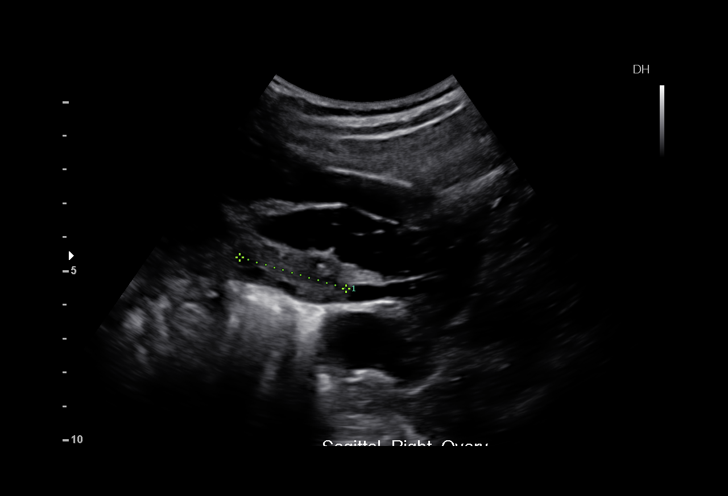
[im 18/37]
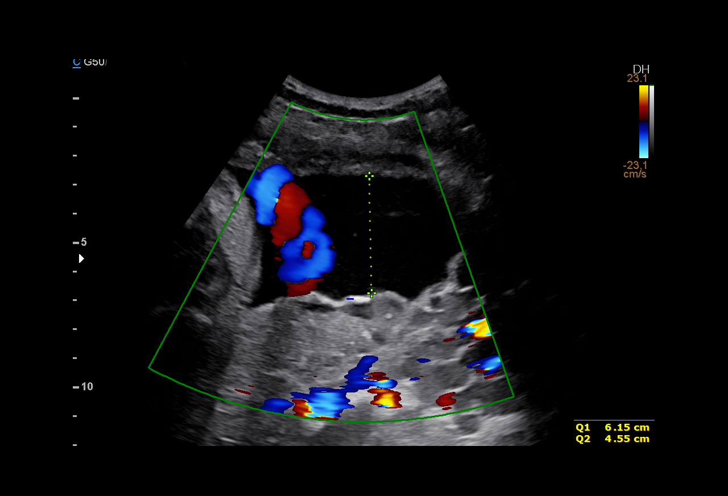
[im 21/37]
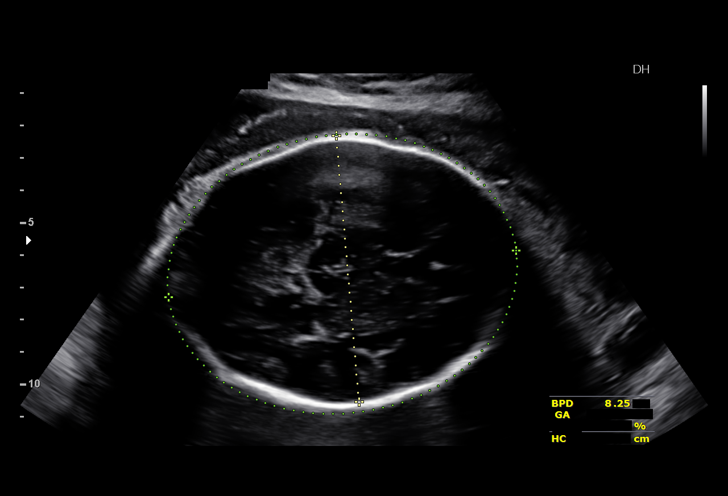
[im 23/37]
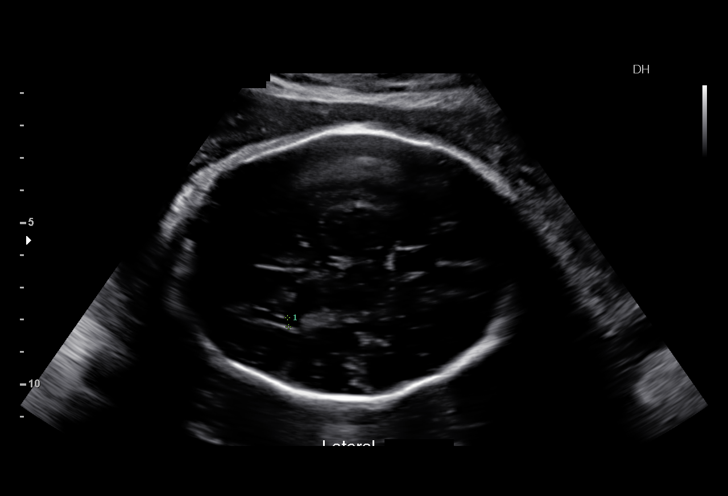
[im 26/37]
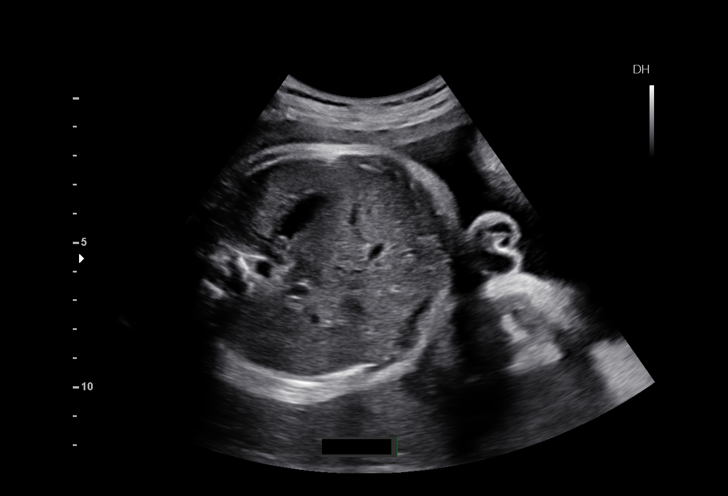
[im 29/37]
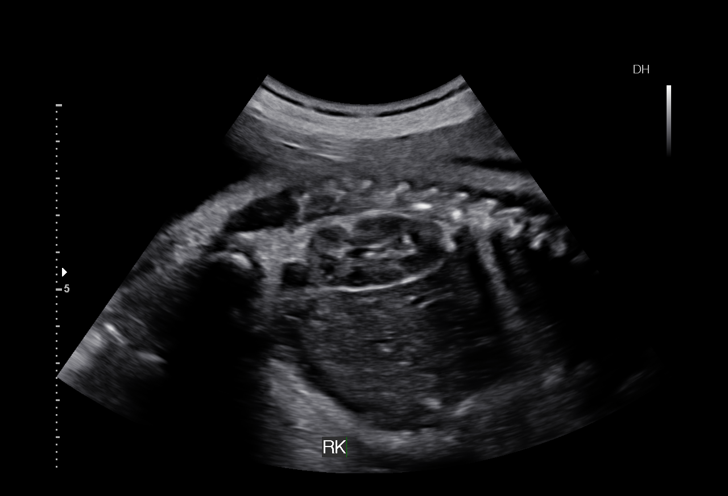
[im 31/37]
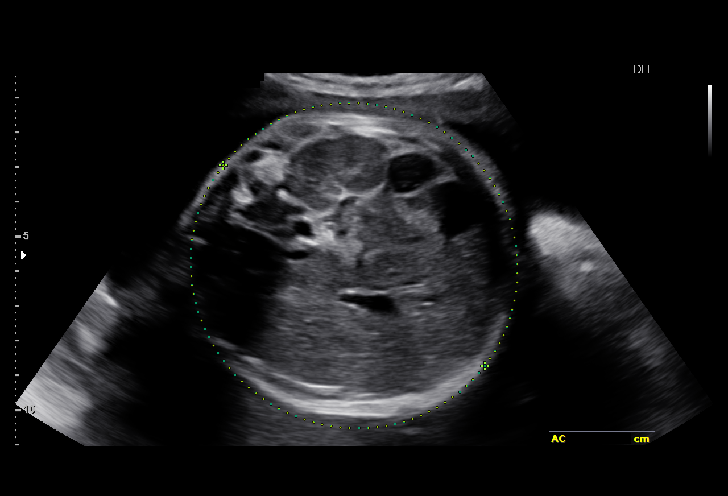
[im 34/37]
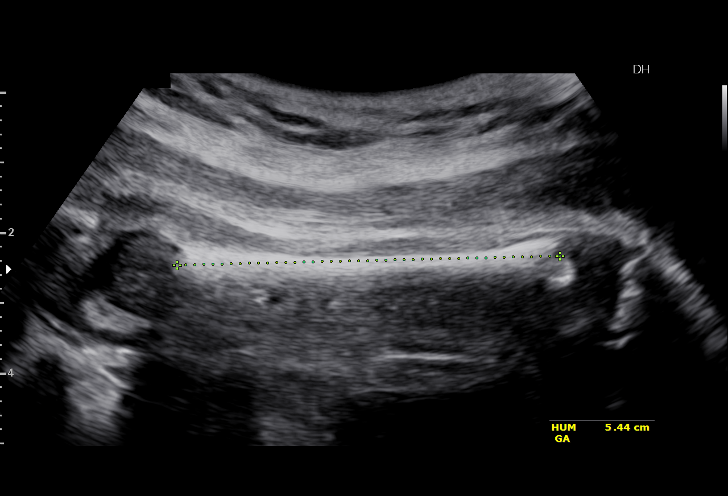
[im 37/37]
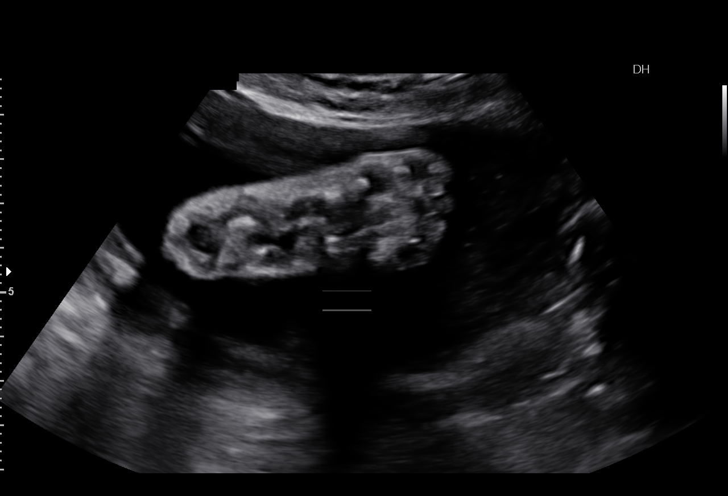

[14 of 28 positions shown; findings below may reference images not displayed]

OBSTETRICS REPORT
(Signed Final 03/14/2015 [DATE])

Service(s) Provided

Indications

34 weeks gestation of pregnancy
Gestational diabetes in pregnancy, insulin
controlled (on glyburide)
Vasa previa
Advanced maternal age multigravida 38, third
trimester; declined testing
BMZ [DATE], [DATE]
Fetal Evaluation

Num Of Fetuses:    1
Fetal Heart Rate:  131                          bpm
Cardiac Activity:  Observed
Presentation:      Cephalic
Placenta:          Left lat, low-lying,
cm from int os
P. Cord            Velamentous ins, Vasa
Insertion:
previa

Amniotic Fluid
AFI FV:      Subjectively within normal limits
AFI Sum:     19.51   cm       73  %Tile     Larg Pckt:    6.15  cm
RUQ:   6.15    cm   RLQ:    4.55   cm    LUQ:   4.05    cm   LLQ:    4.76   cm
Biometry

BPD:     82.9  mm     G. Age:  33w 3d                CI:        74.23   70 - 86
FL/HC:      20.4   19.4 -
21.8
HC:     305.5  mm     G. Age:  34w 0d       12  %    HC/AC:      1.02   0.96 -
1.11
AC:     298.9  mm     G. Age:  33w 6d       42  %    FL/BPD:     75.0   71 - 87
FL:      62.2  mm     G. Age:  32w 1d        5  %    FL/AC:      20.8   20 - 24
HUM:     54.7  mm     G. Age:  31w 6d       10  %

Est. FW:    1728  gm    4 lb 13 oz      42  %
Gestational Age

LMP:           35w 6d        Date:  07/06/14                 EDD:   04/12/15
U/S Today:     33w 3d                                        EDD:   04/29/15
Best:          34w 2d     Det. By:  Early Ultrasound         EDD:   04/23/15
(12/02/14)
Anatomy

Cranium:          Appears normal         Aortic Arch:      Previously seen
Fetal Cavum:      Appears normal         Ductal Arch:      Not well visualized
Ventricles:       Appears normal         Diaphragm:        Previously seen
Choroid Plexus:   Previously seen        Stomach:          Appears normal, left
sided
Cerebellum:       Previously seen        Abdomen:          Appears normal
Posterior Fossa:  Previously seen        Abdominal Wall:   Previously seen
Nuchal Fold:      Not applicable (>20    Cord Vessels:     Previously seen
wks GA)
Face:             Orbits and profile     Kidneys:          Appear normal
previously seen
Lips:             Previously seen        Bladder:          Appears normal
Palate:           Previously seen        Spine:            Previously seen
Heart:            Appears normal         Lower             Previously seen
(4CH, axis, and        Extremities:
situs)
RVOT:             Previously seen        Upper             Previously seen
Extremities:
LVOT:             Previously seen

Other:  Female gender previously seen. Heels and 5th digit previously seen.
Technically difficult due to advanced GA and fetal position.
Cervix Uterus Adnexa

Cervical Length:    3.7      cm

Cervix:       Normal appearance by transabdominal scan.
Left Ovary:    Within normal limits.
Right Ovary:   Within normal limits.

Adnexa:     No abnormality visualized.
Impression

Singleton intrauterine pregnancy at 34 weeks 2 days
gestation with fetal cardiac activity
Cephalic presentaiton
EFW 42nd%
Low-lying placenta with velamentous insertion
Vasa Previa
Normal appearing fluid volume
Recommendations

inpatient admission
twice daily fetal monitoring or prn change in status
delivery is scheduled for [REDACTED] (in 5 days at 35 weeks).

questions or concerns.
